# Patient Record
Sex: Female | Born: 1935 | Hispanic: No | Marital: Married | Smoking: Former smoker
Health system: Southern US, Community
[De-identification: ages and names within clinical notes are randomized; demographics above are authoritative.]

## PROBLEM LIST (undated history)

## (undated) DIAGNOSIS — F0280 Dementia in other diseases classified elsewhere without behavioral disturbance: Secondary | ICD-10-CM

## (undated) DIAGNOSIS — F419 Anxiety disorder, unspecified: Secondary | ICD-10-CM

## (undated) DIAGNOSIS — G309 Alzheimer's disease, unspecified: Secondary | ICD-10-CM

## (undated) DIAGNOSIS — F32A Depression, unspecified: Secondary | ICD-10-CM

## (undated) DIAGNOSIS — R630 Anorexia: Secondary | ICD-10-CM

## (undated) DIAGNOSIS — F329 Major depressive disorder, single episode, unspecified: Secondary | ICD-10-CM

## (undated) HISTORY — PX: APPENDECTOMY: SHX54

## (undated) HISTORY — DX: Major depressive disorder, single episode, unspecified: F32.9

## (undated) HISTORY — PX: EYE SURGERY: SHX253

## (undated) HISTORY — DX: Anxiety disorder, unspecified: F41.9

## (undated) HISTORY — DX: Depression, unspecified: F32.A

---

## 2001-10-15 ENCOUNTER — Other Ambulatory Visit: Admission: RE | Admit: 2001-10-15 | Discharge: 2001-10-15 | Payer: Self-pay | Admitting: Gynecology

## 2002-10-18 ENCOUNTER — Other Ambulatory Visit: Admission: RE | Admit: 2002-10-18 | Discharge: 2002-10-18 | Payer: Self-pay | Admitting: Gynecology

## 2004-01-21 ENCOUNTER — Other Ambulatory Visit: Admission: RE | Admit: 2004-01-21 | Discharge: 2004-01-21 | Payer: Self-pay | Admitting: Gynecology

## 2005-06-06 ENCOUNTER — Other Ambulatory Visit: Admission: RE | Admit: 2005-06-06 | Discharge: 2005-06-06 | Payer: Self-pay | Admitting: Gynecology

## 2008-03-19 ENCOUNTER — Other Ambulatory Visit: Admission: RE | Admit: 2008-03-19 | Discharge: 2008-03-19 | Payer: Self-pay | Admitting: Gynecology

## 2008-03-19 ENCOUNTER — Encounter: Payer: Self-pay | Admitting: Gynecology

## 2008-03-19 ENCOUNTER — Ambulatory Visit: Payer: Self-pay | Admitting: Gynecology

## 2011-04-05 DIAGNOSIS — R413 Other amnesia: Secondary | ICD-10-CM | POA: Diagnosis not present

## 2011-04-05 DIAGNOSIS — E785 Hyperlipidemia, unspecified: Secondary | ICD-10-CM | POA: Diagnosis not present

## 2011-04-05 DIAGNOSIS — K3189 Other diseases of stomach and duodenum: Secondary | ICD-10-CM | POA: Diagnosis not present

## 2011-04-05 DIAGNOSIS — R1013 Epigastric pain: Secondary | ICD-10-CM | POA: Diagnosis not present

## 2011-04-05 DIAGNOSIS — I10 Essential (primary) hypertension: Secondary | ICD-10-CM | POA: Diagnosis not present

## 2011-04-06 DIAGNOSIS — H2589 Other age-related cataract: Secondary | ICD-10-CM | POA: Diagnosis not present

## 2011-04-06 DIAGNOSIS — H269 Unspecified cataract: Secondary | ICD-10-CM | POA: Diagnosis not present

## 2011-04-11 DIAGNOSIS — M719 Bursopathy, unspecified: Secondary | ICD-10-CM | POA: Diagnosis not present

## 2011-04-11 DIAGNOSIS — M67919 Unspecified disorder of synovium and tendon, unspecified shoulder: Secondary | ICD-10-CM | POA: Diagnosis not present

## 2011-04-11 DIAGNOSIS — R52 Pain, unspecified: Secondary | ICD-10-CM | POA: Diagnosis not present

## 2011-04-11 DIAGNOSIS — M25519 Pain in unspecified shoulder: Secondary | ICD-10-CM | POA: Diagnosis not present

## 2011-04-21 DIAGNOSIS — M19019 Primary osteoarthritis, unspecified shoulder: Secondary | ICD-10-CM | POA: Diagnosis not present

## 2011-04-21 DIAGNOSIS — M67919 Unspecified disorder of synovium and tendon, unspecified shoulder: Secondary | ICD-10-CM | POA: Diagnosis not present

## 2011-04-22 DIAGNOSIS — M19019 Primary osteoarthritis, unspecified shoulder: Secondary | ICD-10-CM | POA: Diagnosis not present

## 2011-04-26 DIAGNOSIS — M19019 Primary osteoarthritis, unspecified shoulder: Secondary | ICD-10-CM | POA: Diagnosis not present

## 2011-04-28 DIAGNOSIS — M19019 Primary osteoarthritis, unspecified shoulder: Secondary | ICD-10-CM | POA: Diagnosis not present

## 2011-05-04 DIAGNOSIS — H2589 Other age-related cataract: Secondary | ICD-10-CM | POA: Diagnosis not present

## 2011-05-04 DIAGNOSIS — H269 Unspecified cataract: Secondary | ICD-10-CM | POA: Diagnosis not present

## 2011-05-10 DIAGNOSIS — M19019 Primary osteoarthritis, unspecified shoulder: Secondary | ICD-10-CM | POA: Diagnosis not present

## 2011-05-11 DIAGNOSIS — R300 Dysuria: Secondary | ICD-10-CM | POA: Diagnosis not present

## 2011-05-11 DIAGNOSIS — N390 Urinary tract infection, site not specified: Secondary | ICD-10-CM | POA: Diagnosis not present

## 2011-05-12 DIAGNOSIS — N390 Urinary tract infection, site not specified: Secondary | ICD-10-CM | POA: Diagnosis not present

## 2011-05-12 DIAGNOSIS — M19019 Primary osteoarthritis, unspecified shoulder: Secondary | ICD-10-CM | POA: Diagnosis not present

## 2011-05-17 DIAGNOSIS — H43829 Vitreomacular adhesion, unspecified eye: Secondary | ICD-10-CM | POA: Diagnosis not present

## 2011-05-17 DIAGNOSIS — Z961 Presence of intraocular lens: Secondary | ICD-10-CM | POA: Diagnosis not present

## 2011-05-17 DIAGNOSIS — H43819 Vitreous degeneration, unspecified eye: Secondary | ICD-10-CM | POA: Diagnosis not present

## 2011-05-29 DIAGNOSIS — I10 Essential (primary) hypertension: Secondary | ICD-10-CM | POA: Diagnosis not present

## 2011-05-29 DIAGNOSIS — R55 Syncope and collapse: Secondary | ICD-10-CM | POA: Diagnosis not present

## 2011-05-29 DIAGNOSIS — N390 Urinary tract infection, site not specified: Secondary | ICD-10-CM | POA: Diagnosis not present

## 2011-05-30 DIAGNOSIS — I10 Essential (primary) hypertension: Secondary | ICD-10-CM | POA: Diagnosis not present

## 2011-05-30 DIAGNOSIS — N390 Urinary tract infection, site not specified: Secondary | ICD-10-CM | POA: Diagnosis not present

## 2011-05-30 DIAGNOSIS — R55 Syncope and collapse: Secondary | ICD-10-CM | POA: Diagnosis not present

## 2011-06-03 DIAGNOSIS — H43819 Vitreous degeneration, unspecified eye: Secondary | ICD-10-CM | POA: Diagnosis not present

## 2011-06-03 DIAGNOSIS — R55 Syncope and collapse: Secondary | ICD-10-CM | POA: Diagnosis not present

## 2011-06-03 DIAGNOSIS — Z961 Presence of intraocular lens: Secondary | ICD-10-CM | POA: Diagnosis not present

## 2011-06-03 DIAGNOSIS — H43829 Vitreomacular adhesion, unspecified eye: Secondary | ICD-10-CM | POA: Diagnosis not present

## 2011-07-04 DIAGNOSIS — R413 Other amnesia: Secondary | ICD-10-CM | POA: Diagnosis not present

## 2011-07-04 DIAGNOSIS — I10 Essential (primary) hypertension: Secondary | ICD-10-CM | POA: Diagnosis not present

## 2011-07-04 DIAGNOSIS — F329 Major depressive disorder, single episode, unspecified: Secondary | ICD-10-CM | POA: Insufficient documentation

## 2011-07-04 DIAGNOSIS — E785 Hyperlipidemia, unspecified: Secondary | ICD-10-CM | POA: Diagnosis not present

## 2011-07-04 DIAGNOSIS — F32A Depression, unspecified: Secondary | ICD-10-CM | POA: Insufficient documentation

## 2011-07-05 DIAGNOSIS — Z961 Presence of intraocular lens: Secondary | ICD-10-CM | POA: Diagnosis not present

## 2011-07-05 DIAGNOSIS — H43819 Vitreous degeneration, unspecified eye: Secondary | ICD-10-CM | POA: Diagnosis not present

## 2011-07-05 DIAGNOSIS — H43829 Vitreomacular adhesion, unspecified eye: Secondary | ICD-10-CM | POA: Diagnosis not present

## 2011-07-14 DIAGNOSIS — R55 Syncope and collapse: Secondary | ICD-10-CM | POA: Diagnosis not present

## 2011-07-14 DIAGNOSIS — R413 Other amnesia: Secondary | ICD-10-CM | POA: Diagnosis not present

## 2011-07-14 DIAGNOSIS — I10 Essential (primary) hypertension: Secondary | ICD-10-CM | POA: Diagnosis not present

## 2011-07-26 DIAGNOSIS — R413 Other amnesia: Secondary | ICD-10-CM | POA: Diagnosis not present

## 2011-08-10 DIAGNOSIS — W010XXA Fall on same level from slipping, tripping and stumbling without subsequent striking against object, initial encounter: Secondary | ICD-10-CM | POA: Diagnosis not present

## 2011-08-10 DIAGNOSIS — E669 Obesity, unspecified: Secondary | ICD-10-CM | POA: Diagnosis present

## 2011-08-10 DIAGNOSIS — W19XXXA Unspecified fall, initial encounter: Secondary | ICD-10-CM | POA: Diagnosis not present

## 2011-08-10 DIAGNOSIS — M899 Disorder of bone, unspecified: Secondary | ICD-10-CM | POA: Diagnosis present

## 2011-08-10 DIAGNOSIS — Z9181 History of falling: Secondary | ICD-10-CM | POA: Diagnosis not present

## 2011-08-10 DIAGNOSIS — M8448XA Pathological fracture, other site, initial encounter for fracture: Secondary | ICD-10-CM | POA: Diagnosis not present

## 2011-08-10 DIAGNOSIS — S32009A Unspecified fracture of unspecified lumbar vertebra, initial encounter for closed fracture: Secondary | ICD-10-CM | POA: Diagnosis not present

## 2011-08-10 DIAGNOSIS — M5380 Other specified dorsopathies, site unspecified: Secondary | ICD-10-CM | POA: Diagnosis not present

## 2011-08-10 DIAGNOSIS — M546 Pain in thoracic spine: Secondary | ICD-10-CM | POA: Diagnosis not present

## 2011-08-10 DIAGNOSIS — S0003XA Contusion of scalp, initial encounter: Secondary | ICD-10-CM | POA: Diagnosis not present

## 2011-08-10 DIAGNOSIS — S0083XA Contusion of other part of head, initial encounter: Secondary | ICD-10-CM | POA: Diagnosis not present

## 2011-08-10 DIAGNOSIS — M5126 Other intervertebral disc displacement, lumbar region: Secondary | ICD-10-CM | POA: Diagnosis not present

## 2011-08-10 DIAGNOSIS — G8911 Acute pain due to trauma: Secondary | ICD-10-CM | POA: Diagnosis not present

## 2011-08-10 DIAGNOSIS — R079 Chest pain, unspecified: Secondary | ICD-10-CM | POA: Diagnosis not present

## 2011-08-10 DIAGNOSIS — M549 Dorsalgia, unspecified: Secondary | ICD-10-CM | POA: Diagnosis not present

## 2011-08-10 DIAGNOSIS — K5900 Constipation, unspecified: Secondary | ICD-10-CM | POA: Diagnosis present

## 2011-08-10 DIAGNOSIS — M25559 Pain in unspecified hip: Secondary | ICD-10-CM | POA: Diagnosis not present

## 2011-08-10 DIAGNOSIS — F0390 Unspecified dementia without behavioral disturbance: Secondary | ICD-10-CM | POA: Diagnosis not present

## 2011-08-10 DIAGNOSIS — M545 Low back pain, unspecified: Secondary | ICD-10-CM | POA: Diagnosis not present

## 2011-08-10 DIAGNOSIS — S7000XA Contusion of unspecified hip, initial encounter: Secondary | ICD-10-CM | POA: Diagnosis not present

## 2011-08-10 DIAGNOSIS — R110 Nausea: Secondary | ICD-10-CM | POA: Diagnosis not present

## 2011-08-10 DIAGNOSIS — S1093XA Contusion of unspecified part of neck, initial encounter: Secondary | ICD-10-CM | POA: Diagnosis not present

## 2011-08-10 DIAGNOSIS — I10 Essential (primary) hypertension: Secondary | ICD-10-CM | POA: Diagnosis not present

## 2011-09-02 DIAGNOSIS — S32009A Unspecified fracture of unspecified lumbar vertebra, initial encounter for closed fracture: Secondary | ICD-10-CM | POA: Diagnosis not present

## 2011-09-12 DIAGNOSIS — S32009A Unspecified fracture of unspecified lumbar vertebra, initial encounter for closed fracture: Secondary | ICD-10-CM | POA: Diagnosis not present

## 2011-09-12 DIAGNOSIS — M899 Disorder of bone, unspecified: Secondary | ICD-10-CM | POA: Diagnosis not present

## 2011-10-27 DIAGNOSIS — I10 Essential (primary) hypertension: Secondary | ICD-10-CM | POA: Diagnosis not present

## 2011-10-27 DIAGNOSIS — R413 Other amnesia: Secondary | ICD-10-CM | POA: Diagnosis not present

## 2011-10-27 DIAGNOSIS — R55 Syncope and collapse: Secondary | ICD-10-CM | POA: Diagnosis not present

## 2011-10-27 DIAGNOSIS — F329 Major depressive disorder, single episode, unspecified: Secondary | ICD-10-CM | POA: Diagnosis not present

## 2011-11-03 DIAGNOSIS — H43819 Vitreous degeneration, unspecified eye: Secondary | ICD-10-CM | POA: Diagnosis not present

## 2011-11-03 DIAGNOSIS — Z961 Presence of intraocular lens: Secondary | ICD-10-CM | POA: Diagnosis not present

## 2011-12-27 DIAGNOSIS — Z23 Encounter for immunization: Secondary | ICD-10-CM | POA: Diagnosis not present

## 2012-01-17 DIAGNOSIS — R05 Cough: Secondary | ICD-10-CM | POA: Diagnosis not present

## 2012-01-17 DIAGNOSIS — R0989 Other specified symptoms and signs involving the circulatory and respiratory systems: Secondary | ICD-10-CM | POA: Diagnosis not present

## 2012-01-31 DIAGNOSIS — I10 Essential (primary) hypertension: Secondary | ICD-10-CM | POA: Diagnosis not present

## 2012-01-31 DIAGNOSIS — R1013 Epigastric pain: Secondary | ICD-10-CM | POA: Diagnosis not present

## 2012-01-31 DIAGNOSIS — F329 Major depressive disorder, single episode, unspecified: Secondary | ICD-10-CM | POA: Diagnosis not present

## 2012-01-31 DIAGNOSIS — M159 Polyosteoarthritis, unspecified: Secondary | ICD-10-CM | POA: Diagnosis not present

## 2012-01-31 DIAGNOSIS — K3189 Other diseases of stomach and duodenum: Secondary | ICD-10-CM | POA: Diagnosis not present

## 2012-02-06 DIAGNOSIS — E785 Hyperlipidemia, unspecified: Secondary | ICD-10-CM | POA: Diagnosis not present

## 2012-02-06 DIAGNOSIS — R7309 Other abnormal glucose: Secondary | ICD-10-CM | POA: Diagnosis not present

## 2012-02-06 DIAGNOSIS — I10 Essential (primary) hypertension: Secondary | ICD-10-CM | POA: Diagnosis not present

## 2012-02-06 DIAGNOSIS — Z79899 Other long term (current) drug therapy: Secondary | ICD-10-CM | POA: Diagnosis not present

## 2012-02-06 DIAGNOSIS — R413 Other amnesia: Secondary | ICD-10-CM | POA: Diagnosis not present

## 2012-05-01 DIAGNOSIS — H43819 Vitreous degeneration, unspecified eye: Secondary | ICD-10-CM | POA: Diagnosis not present

## 2012-05-01 DIAGNOSIS — H2510 Age-related nuclear cataract, unspecified eye: Secondary | ICD-10-CM | POA: Diagnosis not present

## 2012-05-16 DIAGNOSIS — I10 Essential (primary) hypertension: Secondary | ICD-10-CM | POA: Diagnosis not present

## 2012-05-16 DIAGNOSIS — R413 Other amnesia: Secondary | ICD-10-CM | POA: Diagnosis not present

## 2012-05-16 DIAGNOSIS — F329 Major depressive disorder, single episode, unspecified: Secondary | ICD-10-CM | POA: Diagnosis not present

## 2012-05-16 DIAGNOSIS — E785 Hyperlipidemia, unspecified: Secondary | ICD-10-CM | POA: Diagnosis not present

## 2012-05-21 DIAGNOSIS — E785 Hyperlipidemia, unspecified: Secondary | ICD-10-CM | POA: Diagnosis not present

## 2012-05-21 DIAGNOSIS — Z79899 Other long term (current) drug therapy: Secondary | ICD-10-CM | POA: Diagnosis not present

## 2012-05-21 DIAGNOSIS — R413 Other amnesia: Secondary | ICD-10-CM | POA: Diagnosis not present

## 2012-06-20 DIAGNOSIS — Z79899 Other long term (current) drug therapy: Secondary | ICD-10-CM | POA: Diagnosis not present

## 2012-06-20 DIAGNOSIS — IMO0001 Reserved for inherently not codable concepts without codable children: Secondary | ICD-10-CM | POA: Diagnosis not present

## 2012-06-20 DIAGNOSIS — M25539 Pain in unspecified wrist: Secondary | ICD-10-CM | POA: Diagnosis not present

## 2012-06-20 DIAGNOSIS — G44309 Post-traumatic headache, unspecified, not intractable: Secondary | ICD-10-CM | POA: Diagnosis not present

## 2012-06-20 DIAGNOSIS — S52599A Other fractures of lower end of unspecified radius, initial encounter for closed fracture: Secondary | ICD-10-CM | POA: Diagnosis not present

## 2012-06-20 DIAGNOSIS — S61209A Unspecified open wound of unspecified finger without damage to nail, initial encounter: Secondary | ICD-10-CM | POA: Diagnosis not present

## 2012-06-20 DIAGNOSIS — S63279A Dislocation of unspecified interphalangeal joint of unspecified finger, initial encounter: Secondary | ICD-10-CM | POA: Diagnosis not present

## 2012-06-20 DIAGNOSIS — E785 Hyperlipidemia, unspecified: Secondary | ICD-10-CM | POA: Diagnosis not present

## 2012-06-20 DIAGNOSIS — S1093XA Contusion of unspecified part of neck, initial encounter: Secondary | ICD-10-CM | POA: Diagnosis not present

## 2012-06-20 DIAGNOSIS — S0083XA Contusion of other part of head, initial encounter: Secondary | ICD-10-CM | POA: Diagnosis not present

## 2012-06-20 DIAGNOSIS — I10 Essential (primary) hypertension: Secondary | ICD-10-CM | POA: Diagnosis not present

## 2012-06-20 DIAGNOSIS — S0003XA Contusion of scalp, initial encounter: Secondary | ICD-10-CM | POA: Diagnosis not present

## 2012-06-20 DIAGNOSIS — R51 Headache: Secondary | ICD-10-CM | POA: Diagnosis not present

## 2012-06-29 DIAGNOSIS — M19049 Primary osteoarthritis, unspecified hand: Secondary | ICD-10-CM | POA: Diagnosis not present

## 2012-06-29 DIAGNOSIS — S63279A Dislocation of unspecified interphalangeal joint of unspecified finger, initial encounter: Secondary | ICD-10-CM | POA: Diagnosis not present

## 2012-07-25 DIAGNOSIS — R413 Other amnesia: Secondary | ICD-10-CM | POA: Diagnosis not present

## 2012-07-30 DIAGNOSIS — S63279A Dislocation of unspecified interphalangeal joint of unspecified finger, initial encounter: Secondary | ICD-10-CM | POA: Diagnosis not present

## 2012-07-30 DIAGNOSIS — M19049 Primary osteoarthritis, unspecified hand: Secondary | ICD-10-CM | POA: Diagnosis not present

## 2012-08-15 DIAGNOSIS — E785 Hyperlipidemia, unspecified: Secondary | ICD-10-CM | POA: Diagnosis not present

## 2012-08-15 DIAGNOSIS — I10 Essential (primary) hypertension: Secondary | ICD-10-CM | POA: Diagnosis not present

## 2012-08-15 DIAGNOSIS — F329 Major depressive disorder, single episode, unspecified: Secondary | ICD-10-CM | POA: Diagnosis not present

## 2012-08-15 DIAGNOSIS — R413 Other amnesia: Secondary | ICD-10-CM | POA: Diagnosis not present

## 2012-08-30 DIAGNOSIS — I8390 Asymptomatic varicose veins of unspecified lower extremity: Secondary | ICD-10-CM | POA: Diagnosis not present

## 2012-08-30 DIAGNOSIS — R58 Hemorrhage, not elsewhere classified: Secondary | ICD-10-CM | POA: Diagnosis not present

## 2012-12-12 DIAGNOSIS — H43819 Vitreous degeneration, unspecified eye: Secondary | ICD-10-CM | POA: Diagnosis not present

## 2012-12-12 DIAGNOSIS — Z961 Presence of intraocular lens: Secondary | ICD-10-CM | POA: Diagnosis not present

## 2012-12-12 DIAGNOSIS — D3120 Benign neoplasm of unspecified retina: Secondary | ICD-10-CM | POA: Diagnosis not present

## 2012-12-12 DIAGNOSIS — H2640 Unspecified secondary cataract: Secondary | ICD-10-CM | POA: Diagnosis not present

## 2013-01-08 DIAGNOSIS — Z23 Encounter for immunization: Secondary | ICD-10-CM | POA: Diagnosis not present

## 2013-02-25 DIAGNOSIS — N390 Urinary tract infection, site not specified: Secondary | ICD-10-CM | POA: Diagnosis not present

## 2013-02-25 DIAGNOSIS — S0990XA Unspecified injury of head, initial encounter: Secondary | ICD-10-CM | POA: Diagnosis not present

## 2013-02-25 DIAGNOSIS — R5381 Other malaise: Secondary | ICD-10-CM | POA: Diagnosis not present

## 2013-02-25 DIAGNOSIS — R5383 Other fatigue: Secondary | ICD-10-CM | POA: Diagnosis not present

## 2013-02-25 DIAGNOSIS — E876 Hypokalemia: Secondary | ICD-10-CM | POA: Diagnosis not present

## 2013-02-25 DIAGNOSIS — I10 Essential (primary) hypertension: Secondary | ICD-10-CM | POA: Diagnosis not present

## 2013-02-25 DIAGNOSIS — S0993XA Unspecified injury of face, initial encounter: Secondary | ICD-10-CM | POA: Diagnosis not present

## 2013-02-25 DIAGNOSIS — Z87891 Personal history of nicotine dependence: Secondary | ICD-10-CM | POA: Diagnosis not present

## 2013-03-25 DIAGNOSIS — N390 Urinary tract infection, site not specified: Secondary | ICD-10-CM | POA: Diagnosis not present

## 2013-03-25 DIAGNOSIS — R413 Other amnesia: Secondary | ICD-10-CM | POA: Diagnosis not present

## 2013-03-25 DIAGNOSIS — E876 Hypokalemia: Secondary | ICD-10-CM | POA: Diagnosis not present

## 2013-03-25 DIAGNOSIS — I10 Essential (primary) hypertension: Secondary | ICD-10-CM | POA: Diagnosis not present

## 2013-04-09 DIAGNOSIS — R413 Other amnesia: Secondary | ICD-10-CM | POA: Diagnosis not present

## 2013-04-09 DIAGNOSIS — G319 Degenerative disease of nervous system, unspecified: Secondary | ICD-10-CM | POA: Diagnosis not present

## 2013-04-09 DIAGNOSIS — I6789 Other cerebrovascular disease: Secondary | ICD-10-CM | POA: Diagnosis not present

## 2013-04-09 DIAGNOSIS — F29 Unspecified psychosis not due to a substance or known physiological condition: Secondary | ICD-10-CM | POA: Diagnosis not present

## 2013-05-03 DIAGNOSIS — K3189 Other diseases of stomach and duodenum: Secondary | ICD-10-CM | POA: Diagnosis not present

## 2013-05-03 DIAGNOSIS — R1013 Epigastric pain: Secondary | ICD-10-CM | POA: Diagnosis not present

## 2013-05-03 DIAGNOSIS — R413 Other amnesia: Secondary | ICD-10-CM | POA: Diagnosis not present

## 2013-05-03 DIAGNOSIS — I10 Essential (primary) hypertension: Secondary | ICD-10-CM | POA: Diagnosis not present

## 2013-05-03 DIAGNOSIS — R300 Dysuria: Secondary | ICD-10-CM | POA: Diagnosis not present

## 2013-06-13 DIAGNOSIS — H2640 Unspecified secondary cataract: Secondary | ICD-10-CM | POA: Diagnosis not present

## 2013-06-13 DIAGNOSIS — Z961 Presence of intraocular lens: Secondary | ICD-10-CM | POA: Diagnosis not present

## 2013-07-05 DIAGNOSIS — M7710 Lateral epicondylitis, unspecified elbow: Secondary | ICD-10-CM | POA: Diagnosis not present

## 2013-07-05 DIAGNOSIS — M67919 Unspecified disorder of synovium and tendon, unspecified shoulder: Secondary | ICD-10-CM | POA: Diagnosis not present

## 2013-07-10 DIAGNOSIS — T148XXA Other injury of unspecified body region, initial encounter: Secondary | ICD-10-CM | POA: Diagnosis not present

## 2013-07-10 DIAGNOSIS — M25519 Pain in unspecified shoulder: Secondary | ICD-10-CM | POA: Diagnosis not present

## 2013-07-10 DIAGNOSIS — S46909A Unspecified injury of unspecified muscle, fascia and tendon at shoulder and upper arm level, unspecified arm, initial encounter: Secondary | ICD-10-CM | POA: Diagnosis not present

## 2013-07-10 DIAGNOSIS — S4980XA Other specified injuries of shoulder and upper arm, unspecified arm, initial encounter: Secondary | ICD-10-CM | POA: Diagnosis not present

## 2013-07-10 DIAGNOSIS — M436 Torticollis: Secondary | ICD-10-CM | POA: Diagnosis not present

## 2013-08-05 DIAGNOSIS — M542 Cervicalgia: Secondary | ICD-10-CM | POA: Diagnosis not present

## 2013-08-05 DIAGNOSIS — M25519 Pain in unspecified shoulder: Secondary | ICD-10-CM | POA: Diagnosis not present

## 2013-08-13 DIAGNOSIS — M25519 Pain in unspecified shoulder: Secondary | ICD-10-CM | POA: Diagnosis not present

## 2013-08-14 DIAGNOSIS — M25519 Pain in unspecified shoulder: Secondary | ICD-10-CM | POA: Diagnosis not present

## 2013-08-15 DIAGNOSIS — M25519 Pain in unspecified shoulder: Secondary | ICD-10-CM | POA: Diagnosis not present

## 2013-08-19 DIAGNOSIS — M25519 Pain in unspecified shoulder: Secondary | ICD-10-CM | POA: Diagnosis not present

## 2013-08-21 DIAGNOSIS — M19019 Primary osteoarthritis, unspecified shoulder: Secondary | ICD-10-CM | POA: Diagnosis not present

## 2013-09-03 DIAGNOSIS — H26499 Other secondary cataract, unspecified eye: Secondary | ICD-10-CM | POA: Diagnosis not present

## 2013-09-13 DIAGNOSIS — H832X9 Labyrinthine dysfunction, unspecified ear: Secondary | ICD-10-CM | POA: Diagnosis not present

## 2013-09-13 DIAGNOSIS — Z961 Presence of intraocular lens: Secondary | ICD-10-CM | POA: Diagnosis not present

## 2013-12-24 DIAGNOSIS — Z23 Encounter for immunization: Secondary | ICD-10-CM | POA: Diagnosis not present

## 2014-01-09 DIAGNOSIS — R300 Dysuria: Secondary | ICD-10-CM | POA: Diagnosis not present

## 2014-01-23 DIAGNOSIS — G301 Alzheimer's disease with late onset: Secondary | ICD-10-CM | POA: Diagnosis not present

## 2014-02-10 DIAGNOSIS — F0391 Unspecified dementia with behavioral disturbance: Secondary | ICD-10-CM | POA: Insufficient documentation

## 2014-02-10 DIAGNOSIS — F03918 Unspecified dementia, unspecified severity, with other behavioral disturbance: Secondary | ICD-10-CM | POA: Insufficient documentation

## 2014-02-10 DIAGNOSIS — F0151 Vascular dementia with behavioral disturbance: Secondary | ICD-10-CM | POA: Diagnosis not present

## 2014-02-10 DIAGNOSIS — G309 Alzheimer's disease, unspecified: Secondary | ICD-10-CM | POA: Diagnosis not present

## 2014-03-12 ENCOUNTER — Encounter (HOSPITAL_COMMUNITY): Payer: Self-pay | Admitting: Emergency Medicine

## 2014-03-12 ENCOUNTER — Emergency Department (HOSPITAL_COMMUNITY): Payer: Medicare Other

## 2014-03-12 ENCOUNTER — Emergency Department (HOSPITAL_COMMUNITY)
Admission: EM | Admit: 2014-03-12 | Discharge: 2014-03-12 | Disposition: A | Discharge status: Home / Self Care | Payer: Medicare Other | Attending: Emergency Medicine | Admitting: Emergency Medicine

## 2014-03-12 DIAGNOSIS — R55 Syncope and collapse: Secondary | ICD-10-CM | POA: Diagnosis present

## 2014-03-12 DIAGNOSIS — R4020 Unspecified coma: Secondary | ICD-10-CM | POA: Insufficient documentation

## 2014-03-12 DIAGNOSIS — G309 Alzheimer's disease, unspecified: Secondary | ICD-10-CM | POA: Insufficient documentation

## 2014-03-12 DIAGNOSIS — R40242 Glasgow coma scale score 9-12: Secondary | ICD-10-CM | POA: Diagnosis not present

## 2014-03-12 DIAGNOSIS — I739 Peripheral vascular disease, unspecified: Secondary | ICD-10-CM | POA: Diagnosis not present

## 2014-03-12 DIAGNOSIS — R410 Disorientation, unspecified: Secondary | ICD-10-CM | POA: Insufficient documentation

## 2014-03-12 DIAGNOSIS — R001 Bradycardia, unspecified: Secondary | ICD-10-CM | POA: Diagnosis not present

## 2014-03-12 DIAGNOSIS — R4182 Altered mental status, unspecified: Secondary | ICD-10-CM | POA: Diagnosis not present

## 2014-03-12 DIAGNOSIS — R404 Transient alteration of awareness: Secondary | ICD-10-CM

## 2014-03-12 HISTORY — DX: Alzheimer's disease, unspecified: G30.9

## 2014-03-12 HISTORY — DX: Dementia in other diseases classified elsewhere, unspecified severity, without behavioral disturbance, psychotic disturbance, mood disturbance, and anxiety: F02.80

## 2014-03-12 LAB — CBC WITH DIFFERENTIAL/PLATELET
Basophils Absolute: 0.1 10*3/uL (ref 0.0–0.1)
Basophils Relative: 1 % (ref 0–1)
EOS PCT: 3 % (ref 0–5)
Eosinophils Absolute: 0.2 10*3/uL (ref 0.0–0.7)
HCT: 42.5 % (ref 36.0–46.0)
HEMOGLOBIN: 13.8 g/dL (ref 12.0–15.0)
LYMPHS ABS: 1.5 10*3/uL (ref 0.7–4.0)
LYMPHS PCT: 24 % (ref 12–46)
MCH: 30.8 pg (ref 26.0–34.0)
MCHC: 32.5 g/dL (ref 30.0–36.0)
MCV: 94.9 fL (ref 78.0–100.0)
MONOS PCT: 9 % (ref 3–12)
Monocytes Absolute: 0.6 10*3/uL (ref 0.1–1.0)
Neutro Abs: 3.9 10*3/uL (ref 1.7–7.7)
Neutrophils Relative %: 63 % (ref 43–77)
PLATELETS: 179 10*3/uL (ref 150–400)
RBC: 4.48 MIL/uL (ref 3.87–5.11)
RDW: 13.8 % (ref 11.5–15.5)
WBC: 6.2 10*3/uL (ref 4.0–10.5)

## 2014-03-12 LAB — URINALYSIS, ROUTINE W REFLEX MICROSCOPIC
BILIRUBIN URINE: NEGATIVE
Glucose, UA: NEGATIVE mg/dL
Hgb urine dipstick: NEGATIVE
Ketones, ur: NEGATIVE mg/dL
Leukocytes, UA: NEGATIVE
NITRITE: NEGATIVE
PH: 5 (ref 5.0–8.0)
Protein, ur: NEGATIVE mg/dL
Specific Gravity, Urine: 1.023 (ref 1.005–1.030)
Urobilinogen, UA: 0.2 mg/dL (ref 0.0–1.0)

## 2014-03-12 LAB — COMPREHENSIVE METABOLIC PANEL
ALBUMIN: 3.5 g/dL (ref 3.5–5.2)
ALT: 14 U/L (ref 0–35)
AST: 20 U/L (ref 0–37)
Alkaline Phosphatase: 68 U/L (ref 39–117)
Anion gap: 8 (ref 5–15)
BUN: 11 mg/dL (ref 6–23)
CALCIUM: 9.3 mg/dL (ref 8.4–10.5)
CO2: 28 mmol/L (ref 19–32)
CREATININE: 0.75 mg/dL (ref 0.50–1.10)
Chloride: 104 mEq/L (ref 96–112)
GFR calc Af Amer: 90 mL/min — ABNORMAL LOW (ref >90)
GFR, EST NON AFRICAN AMERICAN: 77 mL/min — AB (ref >90)
Glucose, Bld: 110 mg/dL — ABNORMAL HIGH (ref 70–99)
Potassium: 3.8 mmol/L (ref 3.5–5.1)
SODIUM: 140 mmol/L (ref 135–145)
TOTAL PROTEIN: 6.5 g/dL (ref 6.0–8.3)
Total Bilirubin: 0.5 mg/dL (ref 0.3–1.2)

## 2014-03-12 LAB — TROPONIN I

## 2014-03-12 NOTE — ED Notes (Signed)
EMS - Patient coming from [employer] with loss of consciousness.  Patients husband states wife went to bed around 9:00pm last night and he attempted to wake her 07:15am today with patient being unresponsive.  Patient was arousable to painful stimuli with EMS.  140/60, HR 70, 109 CBG and 12 respirations.    ED - Patient is alert and responding to staff and family at bedside.

## 2014-03-12 NOTE — Discharge Instructions (Signed)
Follow-up with [facility] neurology in the next week. The contact information has been provided in this discharge summary. Call to arrange this appointment.  Return to the emergency department if your symptoms recur, or you develop any other new and concerning symptoms.   Altered Mental Status Altered mental status most often refers to an abnormal change in your responsiveness and awareness. It can affect your speech, thought, mobility, memory, attention span, or alertness. It can range from slight confusion to complete unresponsiveness (coma). Altered mental status can be a sign of a serious underlying medical condition. Rapid evaluation and medical treatment is necessary for patients having an altered mental status. CAUSES   Low blood sugar (hypoglycemia) or diabetes.  Severe loss of body fluids (dehydration) or a body salt (electrolyte) imbalance.  A stroke or other neurologic problem, such as dementia or delirium.  A head injury or tumor.  A drug or alcohol overdose.  Exposure to toxins or poisons.  Depression, anxiety, and stress.  A low oxygen level (hypoxia).  An infection.  Blood loss.  Twitching or shaking (seizure).  Heart problems, such as heart attack or heart rhythm problems (arrhythmias).  A body temperature that is too low or too high (hypothermia or hyperthermia). DIAGNOSIS  A diagnosis is based on your history, symptoms, physical and neurologic examinations, and diagnostic tests. Diagnostic tests may include:  Measurement of your blood pressure, pulse, breathing, and oxygen levels (vital signs).  Blood tests.  Urine tests.  X-ray exams.  A computerized magnetic scan (magnetic resonance imaging, MRI).  A computerized X-ray scan (computed tomography, CT scan). TREATMENT  Treatment will depend on the cause. Treatment may include:  Management of an underlying medical or mental health condition.  Critical care or support in the hospital. [place]   Only take over-the-counter or prescription medicines for pain, discomfort, or fever as directed by your caregiver.  Manage underlying conditions as directed by your caregiver.  Eat a healthy, well-balanced diet to maintain strength.  Join a support group or prevention program to cope with the condition or trauma that caused the altered mental status. Ask your caregiver to help choose a program that works for you.  Follow up with your caregiver for further examination, therapy, or testing as directed. SEEK MEDICAL CARE IF:   You feel unwell or have chills.  You or your family notice a change in your behavior or your alertness.  You have trouble following your caregiver's treatment plan.  You have questions or concerns. SEEK IMMEDIATE MEDICAL CARE IF:   You have a rapid heartbeat or have chest pain.  You have difficulty breathing.  You have a fever.  You have a headache with a stiff neck.  You cough up blood.  You have blood in your urine or stool.  You have severe agitation or confusion. MAKE SURE YOU:   Understand these instructions.  Will watch your condition.  Will get help right away if you are not doing well or get worse. Document Released: 08/25/2009 Document Revised: 05/30/2011 Document Reviewed: 08/25/2009 [facility] Patient Information 2015 [place], [place]. This information is not intended to replace advice given to you by your health care provider. Make sure you discuss any questions you have with your health care provider.

## 2014-03-12 NOTE — ED Provider Notes (Signed)
CSN: [id number]     Arrival date & time 03/12/14  0902 History   First MD Initiated Contact with Patient 03/12/14 [phone]     Chief Complaint  Patient presents with  . Loss of Consciousness     (Consider location/radiation/quality/duration/timing/severity/associated sxs/prior Treatment) HPI Comments: Patient is an 80 year old female with history of dementia. She is brought by EMS for evaluation of altered level of consciousness. Her husband stated that she would not wake up this morning. He attempted to shake her, speak loudly to her, and stand her up, however she would not wake up. The husband called 23 and EMS arrived on scene. They found her to be responsive to only noxious stimuli and loud voice. She was then brought here for further evaluation. While in the ambulance, her mental status improved and she is now at her baseline. She has no complaints. Specifically she denies any headache, fever, chest pain, shortness of breath, abdominal pain. She denies any alcohol use. She also denies the use of any sleep aids, however she does take medication for her dementia in the evenings.  Patient is a 80 y.o. female presenting with syncope. The history is provided by the patient.  Loss of Consciousness Episode history:  Single Most recent episode:  Today Duration:  1 hour Timing:  Constant Progression:  Resolved Chronicity:  New Context comment:  Sleeping Witnessed: yes   Relieved by:  Nothing Worsened by:  Nothing tried Ineffective treatments:  None tried Associated symptoms: confusion     Past Medical History  Diagnosis Date  . Alzheimer's dementia   . Back pain    Past Surgical History  Procedure Laterality Date  . Back surgery    . Eye surgery    . Appendectomy     No family history on file. History  Substance Use Topics  . Smoking status: Never Smoker   . Smokeless tobacco: Not on file  . Alcohol Use: No   OB History    No data available     Review of Systems   Cardiovascular: Positive for syncope.  Psychiatric/Behavioral: Positive for confusion.  All other systems reviewed and are negative.     Allergies  Review of patient's allergies indicates no known allergies.  Home Medications   Prior to Admission medications   Not on File   BP 139/55 mmHg  Temp(Src) 98.7 F (37.1 C) (Oral)  Resp 18  Ht 4\' 10"  (1.473 m)  Wt 185 lb (83.915 kg)  BMI 38.68 kg/m2  SpO2 95% Physical Exam  Constitutional: She is oriented to person, place, and time. She appears well-developed and well-nourished. No distress.  HENT:  Head: Normocephalic and atraumatic.  Mouth/Throat: Oropharynx is clear and moist.  Eyes: Pupils are equal, round, and reactive to light.  Neck: Normal range of motion. Neck supple.  Cardiovascular: Normal rate and regular rhythm.  Exam reveals no gallop and no friction rub.   No murmur heard. Pulmonary/Chest: Effort normal and breath sounds normal. No respiratory distress. She has no wheezes.  Abdominal: Soft. Bowel sounds are normal. She exhibits no distension. There is no tenderness.  Musculoskeletal: Normal range of motion.  Lymphadenopathy:    She has no cervical adenopathy.  Neurological: She is alert and oriented to person, place, and time. No cranial nerve deficit. She exhibits normal muscle tone. Coordination normal.  Skin: Skin is warm and dry. She is not diaphoretic.  Nursing note and vitals reviewed.   ED Course  Procedures (including critical care time) Labs Review Labs  Reviewed  COMPREHENSIVE METABOLIC PANEL  CBC WITH DIFFERENTIAL  TROPONIN I  URINALYSIS, ROUTINE W REFLEX MICROSCOPIC    Imaging Review No results found.   Date: 03/12/2014  Rate: 53  Rhythm: sinus bradycardia  QRS Axis: left  Intervals: PR prolonged  ST/T Wave abnormalities: normal  Conduction Disutrbances:first-degree A-V block   Narrative Interpretation:   Old EKG Reviewed: none available    MDM   Final diagnoses:  None     Patient brought for evaluation of increased somnolence this morning. Her husband found her difficult to arouse from sleep. He became concerned to the extent that he called 911. She was brought here for evaluation and is now awake, alert, and neurologically intact. Workup reveals no acute process on her head CT and urinalysis and laboratory studies are otherwise unremarkable. I am uncertain as to what caused this episode, but nothing appears emergent. It could be related to her dementia medications she takes prior to bedtime, however this has never happened with these medicines in the past. Either way I feel as though she should be followed up with neurology. She is new to the area from [place] and has no local neurologist. She will be given the contact info for [facility] neurology and she can call to arrange this appointment.    Veryl Speak, MD 03/12/14 [phone]

## 2014-03-17 ENCOUNTER — Ambulatory Visit: Payer: Medicare Other | Admitting: Neurology

## 2014-03-31 DIAGNOSIS — F329 Major depressive disorder, single episode, unspecified: Secondary | ICD-10-CM | POA: Diagnosis not present

## 2014-03-31 DIAGNOSIS — Z23 Encounter for immunization: Secondary | ICD-10-CM | POA: Diagnosis not present

## 2014-03-31 DIAGNOSIS — G301 Alzheimer's disease with late onset: Secondary | ICD-10-CM | POA: Diagnosis not present

## 2014-04-21 ENCOUNTER — Ambulatory Visit (INDEPENDENT_AMBULATORY_CARE_PROVIDER_SITE_OTHER): Payer: Medicare Other | Admitting: Neurology

## 2014-04-21 ENCOUNTER — Encounter: Payer: Self-pay | Admitting: Neurology

## 2014-04-21 VITALS — BP 146/64 | HR 80 | Ht 62.0 in | Wt 185.0 lb

## 2014-04-21 DIAGNOSIS — R404 Transient alteration of awareness: Secondary | ICD-10-CM

## 2014-04-21 DIAGNOSIS — M2550 Pain in unspecified joint: Secondary | ICD-10-CM | POA: Diagnosis not present

## 2014-04-21 NOTE — Patient Instructions (Addendum)
1. Schedule routine EEG 2. Consider taking medications in the daytime 3. Discuss joint pains with your PCP 4. Follow-up in 3 months

## 2014-04-21 NOTE — Progress Notes (Signed)
NEUROLOGY CONSULTATION NOTE  Wendy Hammond MRN: [id number] DOB: August 26, 1930  Referring provider: Dr. Lajean Manes Primary care provider: Dr. Lajean Manes  Reason for consult:  Loss of consciousness  Dear Dr Felipa Eth:  Thank you for your kind referral of Wendy Hammond for consultation of the above symptoms. Although her history is well known to you, please allow me to reiterate it for the purpose of our medical record. The patient was accompanied to the clinic by her husband who also provides collateral information. Records and images were personally reviewed where available.  HISTORY OF PRESENT ILLNESS: This is a pleasant 81 year old right-handed woman with a history of dementia, depression, anxiety, in her usual state of health until 03/12/14 when they were going for a trip and he was trying to wake her up at 7am and could not arouse her. He called EMS and upon arrival she was responsive only to noxious stimuli and loud voice. She started to wake up en route to [facility] and was at baseline in the ER. She denied any symptoms and did not recall the episode. No prior similar symptoms, and no recurrent episodes since then. At the ER, CBC, CMP, urinalysis were unremarkable. I personally reviewed head CT without did not show any acute changes, there was moderate diffuse atrophy and patchy small vessel disease bilaterally. He reports that he gives her all her medications at bedtime, no change in routine. No sleep deprivation the night prior.   She denies any headaches, dizziness, diplopia, dysarthria, dysphagia, neck pain, bowel/bladder dysfunction, focal numbness/tingling/weakness. She has had 3 falls over the past 3 years, last fall was over a year ago. Her husband denies any staring/unresponsive episodes, she denies any olfactory/gustatory hallucinations, deja vu, rising epigastric sensation, myoclonic jerks.  She had a normal birth and early development.  There is no history of febrile convulsions,  CNS infections such as meningitis/encephalitis, significant traumatic brain injury, neurosurgical procedures, or family history of seizures.  PAST MEDICAL HISTORY: Past Medical History  Diagnosis Date  . Alzheimer's dementia   . Anxiety   . Depression     PAST SURGICAL HISTORY: Past Surgical History  Procedure Laterality Date  . Eye surgery    . Appendectomy      MEDICATIONS: Aricept 10mg  daily Namenda XR 28mg  daily Zoloft 50mg  daily   No current facility-administered medications on file prior to visit.    ALLERGIES: No Known Allergies  FAMILY HISTORY: No family history on file.  SOCIAL HISTORY: History   Social History  . Marital Status: Married    Spouse Name: N/A    Number of Children: N/A  . Years of Education: N/A   Occupational History  . Not on file.   Social History Main Topics  . Smoking status: Former Smoker    Quit date: 03/21/1957  . Smokeless tobacco: Not on file  . Alcohol Use: No  . Drug Use: No  . Sexual Activity: Not on file   Other Topics Concern  . Not on file   Social History Narrative    REVIEW OF SYSTEMS: Constitutional: No fevers, chills, or sweats, no generalized fatigue, change in appetite Eyes: No visual changes, double vision, eye pain Ear, nose and throat: No hearing loss, ear pain, nasal congestion, sore throat Cardiovascular: No chest pain, palpitations Respiratory:  No shortness of breath at rest or with exertion, wheezes GastrointestinaI: No nausea, vomiting, diarrhea, abdominal pain, fecal incontinence Genitourinary:  No dysuria, urinary retention or frequency Musculoskeletal:  No neck pain, +back pain, +  joint pains Integumentary: No rash, pruritus, skin lesions Neurological: as above Psychiatric: No depression, insomnia, anxiety Endocrine: No palpitations, fatigue, diaphoresis, mood swings, change in appetite, change in weight, increased thirst Hematologic/Lymphatic:  No anemia, purpura,  petechiae. Allergic/Immunologic: no itchy/runny eyes, nasal congestion, recent allergic reactions, rashes  PHYSICAL EXAM: Filed Vitals:   04/21/14 1326  BP: 146/64  Pulse: 80   General: No acute distress Head:  Normocephalic/atraumatic Eyes: Fundoscopic exam shows bilateral sharp discs, no vessel changes, exudates, or hemorrhages Neck: supple, no paraspinal tenderness, full range of motion Back: No paraspinal tenderness Heart: regular rate and rhythm Lungs: Clear to auscultation bilaterally. Vascular: No carotid bruits. Skin/Extremities: No rash, no edema Neurological Exam: Mental status: alert and oriented to person, place, no dysarthria or aphasia, Fund of knowledge is appropriate.  Remote memory intact.  Attention and concentration are normal.    Able to name objects and repeat phrases. Cranial nerves: CN I: not tested CN II: pupils equal, round and reactive to light, visual fields intact, fundi unremarkable. CN III, IV, VI:  full range of motion, no nystagmus, no ptosis CN V: facial sensation intact CN VII: upper and lower face symmetric CN VIII: hearing intact to finger rub CN IX, X: gag intact, uvula midline CN XI: sternocleidomastoid and trapezius muscles intact CN XII: tongue midline Bulk & Tone: normal, no fasciculations. Motor: 5/5 throughout with no pronator drift. Sensation: intact to light touch, cold, pin, vibration and joint position sense.  No extinction to double simultaneous stimulation.  Romberg test negative Deep Tendon Reflexes: +1 throughout, no ankle clonus Plantar responses: downgoing bilaterally Cerebellar: no incoordination on finger to nose, heel to shin. No dysdiadochokinesia Gait: narrow-based and steady, able to tandem walk adequately. Tremor: none  IMPRESSION: This is a pleasant 81 year old right-handed woman with a history of dementia, depression, anxiety, presenting after an episode where she was very difficult to arouse last 03/12/14. She was  back to baseline upon arrival to ER. Neurological exam today non-focal, head CT unremarkable. The etiology of the episode is unclear. No change in medication routine, however her husband will start giving her the medications in the morning. Routine EEG will be ordered to assess for focal abnormalities that increase risk for recurrent seizures. She does not drive. She will follow-up in 3 months.   Thank you for allowing me to participate in the care of this patient. Please do not hesitate to call for any questions or concerns.   Ellouise Newer, M.D.  CC: Dr. Felipa Eth

## 2014-04-23 ENCOUNTER — Encounter: Payer: Self-pay | Admitting: Neurology

## 2014-04-30 ENCOUNTER — Ambulatory Visit (INDEPENDENT_AMBULATORY_CARE_PROVIDER_SITE_OTHER): Payer: Medicare Other | Admitting: Neurology

## 2014-04-30 DIAGNOSIS — R404 Transient alteration of awareness: Secondary | ICD-10-CM | POA: Diagnosis not present

## 2014-05-06 NOTE — Procedures (Signed)
ELECTROENCEPHALOGRAM REPORT  Date of Study: 04/30/2014  Patient's Name: Wendy Hammond MRN: [id number] Date of Birth: June 19, 1930  Referring Provider: Dr. Ellouise Newer  Clinical History: This is an 81 year old woman with a history of dementia, depression, anxiety, presenting after an episode where she was very difficult to arouse last 03/12/14  Medications: Aricept, Namenda, Zoloft  Technical Summary: A multichannel digital EEG recording measured by the international 10-20 system with electrodes applied with paste and impedances below 5000 ohms performed in our laboratory with EKG monitoring in an awake and asleep patient.  Hyperventilation and photic stimulation were performed.  The digital EEG was referentially recorded, reformatted, and digitally filtered in a variety of bipolar and referential montages for optimal display.    Description: The patient is awake and asleep during the recording.  During maximal wakefulness, there is a poorly sustained 8 Hz posterior dominant rhythm that poorly attenuates to eye opening and eye closure. The record is symmetric.  During drowsiness and stage I sleep, there is an increase in theta slowing of the background with occasional vertex waves seen.  Hyperventilation and photic stimulation did not elicit any abnormalities.  There were no epileptiform discharges or electrographic seizures seen.    EKG lead showed sinus bradycardia at 60 bpm.  Impression: This awake and asleep EEG is normal.    Clinical Correlation: A normal EEG does not exclude a clinical diagnosis of epilepsy.  If further clinical questions remain, prolonged EEG may be helpful.  Clinical correlation is advised.   Ellouise Newer, M.D.

## 2014-05-09 ENCOUNTER — Encounter: Payer: Self-pay | Admitting: Family Medicine

## 2014-05-16 DIAGNOSIS — H59099 Other disorders of unspecified eye following cataract surgery: Secondary | ICD-10-CM | POA: Diagnosis not present

## 2014-05-16 DIAGNOSIS — Z961 Presence of intraocular lens: Secondary | ICD-10-CM | POA: Diagnosis not present

## 2014-05-19 ENCOUNTER — Telehealth: Payer: Self-pay | Admitting: Neurology

## 2014-05-19 NOTE — Telephone Encounter (Signed)
Richard pt's spouse called f/u on pt's EEG results. C/b [phone]

## 2014-05-19 NOTE — Telephone Encounter (Signed)
Left normal results on machine and per Tiffany she mailed out a copy. Advises patient if there were and questions or concerns please give [place] a call back.

## 2014-07-21 ENCOUNTER — Encounter: Payer: Self-pay | Admitting: Neurology

## 2014-07-21 ENCOUNTER — Ambulatory Visit (INDEPENDENT_AMBULATORY_CARE_PROVIDER_SITE_OTHER): Payer: Medicare Other | Admitting: Neurology

## 2014-07-21 VITALS — BP 118/76 | HR 68 | Resp 16 | Ht 62.0 in | Wt 193.0 lb

## 2014-07-21 DIAGNOSIS — R404 Transient alteration of awareness: Secondary | ICD-10-CM

## 2014-07-21 NOTE — Patient Instructions (Signed)
1. Continue all your medications 2. If symptoms recur, call our office, otherwise follow-up on an as needed basis

## 2014-07-21 NOTE — Progress Notes (Signed)
NEUROLOGY FOLLOW UP OFFICE NOTE  Wendy Hammond [id number]  HISTORY OF PRESENT ILLNESS: I had the pleasure of seeing Wendy Hammond in follow-up in the neurology clinic on 07/21/2014. She is again accompanied by her husband today. The patient was last seen 3 months ago after an episode of unresponsiveness in December 2015. Her routine EEG was normal. Since her last visit, her husband denies any further similar symptoms. She denies any headaches, dizziness, focal numbness/tingling/weakness. She had chronic back pain with difficulties in transfers. No bowel/bladder dysfunction. Her husband is her primary caregiver for her ADLs, they have a visiting aide coming 3 times a week.  HPI: This is a pleasant 81 yo RH woman with a history of dementia, depression, anxiety, in her usual state of health until 03/12/14 when they were going for a trip and he was trying to wake her up at 7am and could not arouse her. He called EMS and upon arrival she was responsive only to noxious stimuli and loud voice. She started to wake up en route to [facility] and was at baseline in the ER. She denied any symptoms and did not recall the episode. No prior similar symptoms, and no recurrent episodes since then. At the ER, CBC, CMP, urinalysis were unremarkable. I personally reviewed head CT without did not show any acute changes, there was moderate diffuse atrophy and patchy small vessel disease bilaterally. He reports that he gives her all her medications at bedtime, no change in routine. No sleep deprivation the night prior.   PAST MEDICAL HISTORY: Past Medical History  Diagnosis Date  . Alzheimer's dementia   . Anxiety   . Depression     MEDICATIONS: Current Outpatient Prescriptions on File Prior to Visit  Medication Sig Dispense Refill  . donepezil (ARICEPT) 10 MG tablet Take 10 mg by mouth at bedtime.   0  . NAMENDA XR 28 MG CP24 24 hr capsule Take 28 mg by mouth daily.   11  . sertraline (ZOLOFT) 50 MG tablet Take 50 mg  by mouth daily.   6   No current facility-administered medications on file prior to visit.    ALLERGIES: No Known Allergies  FAMILY HISTORY: No family history on file.  SOCIAL HISTORY: History   Social History  . Marital Status: Married    Spouse Name: N/A  . Number of Children: N/A  . Years of Education: N/A   Occupational History  . Not on file.   Social History Main Topics  . Smoking status: Former Smoker    Quit date: 03/21/1957  . Smokeless tobacco: Not on file  . Alcohol Use: No  . Drug Use: No  . Sexual Activity: Not on file   Other Topics Concern  . Not on file   Social History Narrative    REVIEW OF SYSTEMS: Constitutional: No fevers, chills, or sweats, no generalized fatigue, change in appetite Eyes: No visual changes, double vision, eye pain Ear, nose and throat: No hearing loss, ear pain, nasal congestion, sore throat Cardiovascular: No chest pain, palpitations Respiratory:  No shortness of breath at rest or with exertion, wheezes GastrointestinaI: No nausea, vomiting, diarrhea, abdominal pain, fecal incontinence Genitourinary:  No dysuria, urinary retention or frequency Musculoskeletal:  No neck pain, +back pain Integumentary: No rash, pruritus, skin lesions Neurological: as above Psychiatric: No depression, insomnia, anxiety Endocrine: No palpitations, fatigue, diaphoresis, mood swings, change in appetite, change in weight, increased thirst Hematologic/Lymphatic:  No anemia, purpura, petechiae. Allergic/Immunologic: no itchy/runny eyes, nasal congestion, recent allergic  reactions, rashes  PHYSICAL EXAM: Filed Vitals:   07/21/14 1507  BP: 118/76  Pulse: 68  Resp: 16   General: No acute distress Head: Normocephalic/atraumatic Eyes: Fundoscopic exam shows bilateral sharp discs, no vessel changes, exudates, or hemorrhages Neck: supple, no paraspinal tenderness, full range of motion Back: No paraspinal tenderness Heart: regular rate and  rhythm Lungs: Clear to auscultation bilaterally. Vascular: No carotid bruits. Skin/Extremities: No rash, no edema Neurological Exam: Mental status: alert and oriented to person. She does not know the date, states that we are in [place]. She does not want to say the president's name ("the one I don't like"). No dysarthria or aphasia, Fund of knowledge is appropriate. 0/3 delayed recall. Attention and concentration are normal. Able to name objects and repeat phrases. Cranial nerves: CN I: not tested CN II: pupils equal, round and reactive to light, ?left homonymous hemianopia vs neglect, fundi unremarkable. CN III, IV, VI: full range of motion, no nystagmus, no ptosis CN V: facial sensation intact CN VII: shallow left nasolabial fold CN VIII: hearing intact to finger rub CN IX, X: gag intact, uvula midline CN XI: sternocleidomastoid and trapezius muscles intact CN XII: tongue midline Bulk & Tone: normal, no fasciculations. Motor: 5/5 throughout with no pronator drift. Sensation: intact to light touch. No extinction to double simultaneous stimulation. Romberg test negative Deep Tendon Reflexes: +1 throughout, no ankle clonus Plantar responses: downgoing bilaterally Cerebellar: no incoordination on finger to nose testing Gait: slow and cautious, no ataxia Tremor: none  IMPRESSION: This is a pleasant 81 yo RH woman with a history of dementia, depression, anxiety, who had an episode where she was very difficult to arouse last 03/12/14. She was back to baseline upon arrival to ER. Head CT and routine EEG unremarkable. The etiology of the episode is unclear, no further similar symptoms since December 2015. Findings were discussed with her husband, continue current medications. If symptoms recur, prolonged EEG will be done. She will follow-up on a prn basis and her husband knows to call our office for any changes.   Thank you for allowing me to participate in her care.  Please do not  hesitate to call for any questions or concerns.  The duration of this appointment visit was 15 minutes of face-to-face time with the patient.  Greater than 50% of this time was spent in counseling, explanation of diagnosis, planning of further management, and coordination of care.   Ellouise Newer, M.D.   CC: Dr. Felipa Eth

## 2014-09-01 DIAGNOSIS — Z6841 Body Mass Index (BMI) 40.0 and over, adult: Secondary | ICD-10-CM | POA: Diagnosis not present

## 2014-09-01 DIAGNOSIS — G301 Alzheimer's disease with late onset: Secondary | ICD-10-CM | POA: Diagnosis not present

## 2014-09-01 DIAGNOSIS — F325 Major depressive disorder, single episode, in full remission: Secondary | ICD-10-CM | POA: Diagnosis not present

## 2014-11-04 DIAGNOSIS — R350 Frequency of micturition: Secondary | ICD-10-CM | POA: Diagnosis not present

## 2014-12-16 ENCOUNTER — Emergency Department (HOSPITAL_COMMUNITY): Payer: Medicare Other

## 2014-12-16 ENCOUNTER — Encounter (HOSPITAL_COMMUNITY): Payer: Self-pay | Admitting: *Deleted

## 2014-12-16 ENCOUNTER — Emergency Department (HOSPITAL_COMMUNITY)
Admission: EM | Admit: 2014-12-16 | Discharge: 2014-12-16 | Disposition: A | Discharge status: Home / Self Care | Payer: Medicare Other | Attending: Emergency Medicine | Admitting: Emergency Medicine

## 2014-12-16 DIAGNOSIS — K8020 Calculus of gallbladder without cholecystitis without obstruction: Secondary | ICD-10-CM

## 2014-12-16 DIAGNOSIS — F329 Major depressive disorder, single episode, unspecified: Secondary | ICD-10-CM | POA: Insufficient documentation

## 2014-12-16 DIAGNOSIS — Z79899 Other long term (current) drug therapy: Secondary | ICD-10-CM | POA: Diagnosis not present

## 2014-12-16 DIAGNOSIS — R1011 Right upper quadrant pain: Secondary | ICD-10-CM | POA: Diagnosis present

## 2014-12-16 DIAGNOSIS — R918 Other nonspecific abnormal finding of lung field: Secondary | ICD-10-CM | POA: Diagnosis not present

## 2014-12-16 DIAGNOSIS — Z87891 Personal history of nicotine dependence: Secondary | ICD-10-CM | POA: Diagnosis not present

## 2014-12-16 DIAGNOSIS — G309 Alzheimer's disease, unspecified: Secondary | ICD-10-CM | POA: Diagnosis not present

## 2014-12-16 DIAGNOSIS — F0280 Dementia in other diseases classified elsewhere without behavioral disturbance: Secondary | ICD-10-CM | POA: Diagnosis not present

## 2014-12-16 DIAGNOSIS — F419 Anxiety disorder, unspecified: Secondary | ICD-10-CM | POA: Diagnosis not present

## 2014-12-16 LAB — URINALYSIS, ROUTINE W REFLEX MICROSCOPIC
Bilirubin Urine: NEGATIVE
Glucose, UA: NEGATIVE mg/dL
Hgb urine dipstick: NEGATIVE
Ketones, ur: NEGATIVE mg/dL
NITRITE: NEGATIVE
Protein, ur: NEGATIVE mg/dL
SPECIFIC GRAVITY, URINE: 1.025 (ref 1.005–1.030)
UROBILINOGEN UA: 0.2 mg/dL (ref 0.0–1.0)
pH: 6 (ref 5.0–8.0)

## 2014-12-16 LAB — COMPREHENSIVE METABOLIC PANEL
ALK PHOS: 72 U/L (ref 38–126)
ALT: 12 U/L — ABNORMAL LOW (ref 14–54)
ANION GAP: 7 (ref 5–15)
AST: 22 U/L (ref 15–41)
Albumin: 4 g/dL (ref 3.5–5.0)
BILIRUBIN TOTAL: 0.9 mg/dL (ref 0.3–1.2)
BUN: 15 mg/dL (ref 6–20)
CALCIUM: 9.2 mg/dL (ref 8.9–10.3)
CO2: 28 mmol/L (ref 22–32)
Chloride: 102 mmol/L (ref 101–111)
Creatinine, Ser: 0.7 mg/dL (ref 0.44–1.00)
Glucose, Bld: 127 mg/dL — ABNORMAL HIGH (ref 65–99)
POTASSIUM: 3.4 mmol/L — AB (ref 3.5–5.1)
Sodium: 137 mmol/L (ref 135–145)
TOTAL PROTEIN: 8.1 g/dL (ref 6.5–8.1)

## 2014-12-16 LAB — CBC
HEMATOCRIT: 44.4 % (ref 36.0–46.0)
HEMOGLOBIN: 14.7 g/dL (ref 12.0–15.0)
MCH: 30.7 pg (ref 26.0–34.0)
MCHC: 33.1 g/dL (ref 30.0–36.0)
MCV: 92.7 fL (ref 78.0–100.0)
Platelets: 190 10*3/uL (ref 150–400)
RBC: 4.79 MIL/uL (ref 3.87–5.11)
RDW: 13.6 % (ref 11.5–15.5)
WBC: 11.5 10*3/uL — AB (ref 4.0–10.5)

## 2014-12-16 LAB — URINE MICROSCOPIC-ADD ON

## 2014-12-16 LAB — LIPASE, BLOOD: Lipase: 29 U/L (ref 22–51)

## 2014-12-16 MED ORDER — OXYCODONE-ACETAMINOPHEN 5-325 MG PO TABS: 2.0000 | ORAL_TABLET | ORAL | Status: DC | PRN

## 2014-12-16 MED ORDER — IOHEXOL 300 MG/ML  SOLN
100.0000 mL | Freq: Once | INTRAMUSCULAR | Status: AC | PRN
  Administered 2014-12-16: 100 mL via INTRAVENOUS

## 2014-12-16 MED ORDER — IOHEXOL 300 MG/ML  SOLN
25.0000 mL | Freq: Once | INTRAMUSCULAR | Status: AC | PRN
  Administered 2014-12-16: 25 mL via ORAL

## 2014-12-16 MED ORDER — SODIUM CHLORIDE 0.9 % IV BOLUS (SEPSIS)
1000.0000 mL | Freq: Once | INTRAVENOUS | Status: AC
  Administered 2014-12-16: 1000 mL via INTRAVENOUS

## 2014-12-16 NOTE — ED Notes (Signed)
Patient transported to CT 

## 2014-12-16 NOTE — ED Notes (Signed)
I attempted to collect labs and was unsuccessful. 

## 2014-12-16 NOTE — ED Provider Notes (Signed)
CSN: [id number]     Arrival date & time 12/16/14  1543 History   First MD Initiated Contact with Patient 12/16/14 1555     Chief Complaint  Patient presents with  . Abdominal Pain     (Consider location/radiation/quality/duration/timing/severity/associated sxs/prior Treatment) HPI Comments: Wendy Hammond is a 81 y.o F with significant dementia who presents to the emergency department today via EMS from her nursing home to be evaluated for abdominal pain. History is limited by patient's condition, Alzheimer's dementia. Patient was accompanied by spouse. Patient currently without complaint and symptom free. Per patient's spouse over the last 3 days patient has been complaining of right upper quadrant abdominal pain. Last night patient was screaming in pain. Denies dysuria, abdominal pain, fevers, vomiting. Patient is able to ambulate with assistance, this is her baseline. Patient sposue states patient's last bowel movement was yesterday.   Patient is a 81 y.o. female presenting with abdominal pain. The history is provided by the patient and the spouse. The history is limited by the condition of the patient.  Abdominal Pain   Past Medical History  Diagnosis Date  . Alzheimer's dementia   . Anxiety   . Depression    Past Surgical History  Procedure Laterality Date  . Eye surgery    . Appendectomy     No family history on file. Social History  Substance Use Topics  . Smoking status: Former Smoker    Quit date: 03/21/1957  . Smokeless tobacco: None  . Alcohol Use: No   OB History    No data available     Review of Systems  Gastrointestinal: Positive for abdominal pain.      Allergies  Review of patient's allergies indicates no known allergies.  Home Medications   Prior to Admission medications   Medication Sig Start Date End Date Taking? Authorizing Provider  CALCIUM CARBONATE PO Take 1 tablet by mouth daily.   Yes Historical Provider, MD  donepezil (ARICEPT) 10 MG  tablet Take 10 mg by mouth at bedtime.  01/12/14  Yes Historical Provider, MD  NAMENDA XR 28 MG CP24 24 hr capsule Take 28 mg by mouth at bedtime.  04/17/14  Yes Historical Provider, MD  sertraline (ZOLOFT) 50 MG tablet Take 25 mg by mouth daily.  03/23/14  Yes Historical Provider, MD   BP 118/57 mmHg  Pulse 79  Temp(Src) 98 F (36.7 C) (Oral)  Resp 16  SpO2 90% Physical Exam  Constitutional: She appears well-developed and well-nourished. No distress.  HENT:  Head: Normocephalic and atraumatic.  Mouth/Throat: No oropharyngeal exudate.  Eyes: Conjunctivae and EOM are normal. Pupils are equal, round, and reactive to light. Right eye exhibits no discharge. Left eye exhibits no discharge. No scleral icterus.  Neck:  No meningismus  Cardiovascular: Normal rate, regular rhythm, normal heart sounds and intact distal pulses.  Exam reveals no gallop and no friction rub.   No murmur heard. Pulmonary/Chest: Effort normal and breath sounds normal. No respiratory distress. She has no wheezes. She has no rales. She exhibits no tenderness.  Abdominal: Soft. Bowel sounds are normal. She exhibits no distension and no mass. There is no tenderness. There is no rebound and no guarding.  Negative Murphy's sign.  Musculoskeletal: Normal range of motion. She exhibits no edema.  Neurological: She is alert.  Strength 5/5 throughout. No sensory deficits.    Skin: Skin is warm and dry. No rash noted. She is not diaphoretic. No erythema. No pallor.  Psychiatric:  Pt wits Alzheimer's dementia.  Pleasant. Not oriented. Alert.  Nursing note and vitals reviewed.   ED Course  Procedures (including critical care time) Labs Review Labs Reviewed  COMPREHENSIVE METABOLIC PANEL - Abnormal; Notable for the following:    Potassium 3.4 (*)    Glucose, Bld 127 (*)    ALT 12 (*)    All other components within normal limits  CBC - Abnormal; Notable for the following:    WBC 11.5 (*)    All other components within normal  limits  URINALYSIS, ROUTINE W REFLEX MICROSCOPIC (NOT AT [facility]) - Abnormal; Notable for the following:    Color, Urine AMBER (*)    Leukocytes, UA TRACE (*)    All other components within normal limits  LIPASE, BLOOD  URINE MICROSCOPIC-ADD ON    Imaging Review Ct Chest Wo Contrast  12/16/2014   CLINICAL DATA:  Airspace opacities. Abnormality seen on CT of the abdomen and pelvis.  EXAM: CT CHEST WITHOUT CONTRAST  TECHNIQUE: Multidetector CT imaging of the chest was performed following the standard protocol without IV contrast.  COMPARISON:  12/16/2014 CT  FINDINGS: Heart: Significant calcification of the coronary arteries and aortic valve. Heart is mildly enlarged. No pericardial effusion.  Vascular structures: Atherosclerotic calcification of the thoracic aorta. Tortuosity of the great vessels. No aneurysm.  Mediastinum/thyroid: The visualized portion of the thyroid gland has a normal appearance. Small, nonspecific mediastinal lymph nodes are identified. These may be reactive. Small precarinal lymph node is 0.8 cm. Left diaphragmatic lymph node is 0.8 cm.  Lungs/Airways: There is airspace disease identified at the lung bases bilaterally, right greater than left. Small right pleural effusion better seen on CT of the abdomen. Associated air bronchograms identified. No significant pulmonary edema. There are no suspicious pulmonary nodules.  Upper abdomen: 0.7 cm nodule identified at the dome of the left hepatic lobe, nonspecific in appearance. A 1.0 cm splenic artery aneurysm again seen.  Chest wall/osseous structures: Ankylosis of the mid thoracic spine. No suspicious lytic or blastic lesions are identified.  IMPRESSION: 1. Airspace disease identified at the lung bases, right greater than left. Findings favor infectious process. Follow-up chest x-ray recommended to document clearing in 3-4 weeks. 2. Significant coronary artery disease.  Mild cardiomegaly. 3. Atherosclerotic calcification of the thoracic  aorta. 4. Nonspecific mediastinal lymph nodes may be reactive. 5. Nonspecific left hepatic nodule, favored to be benign. 6. 1 cm splenic artery aneurysm.   Electronically Signed   By: Nolon Nations M.D.   On: 12/16/2014 19:48   Ct Abdomen Pelvis W Contrast  12/16/2014   CLINICAL DATA:  Right upper quadrant abdominal pain with dark colored urine for 1 day. History of appendectomy and dementia. Initial encounter.  EXAM: CT ABDOMEN AND PELVIS WITH CONTRAST  TECHNIQUE: Multidetector CT imaging of the abdomen and pelvis was performed using the standard protocol following bolus administration of intravenous contrast.  CONTRAST:  26mL OMNIPAQUE IOHEXOL 300 MG/ML SOLN, 12mL OMNIPAQUE IOHEXOL 300 MG/ML SOLN  COMPARISON:  None.  FINDINGS: Lower chest: There are dependent airspace opacities at both lung bases and a small right pleural effusion. No significant pericardial effusion. Small hiatal hernia noted.  Hepatobiliary: Probable 7 mm cyst centrally in the liver on image 8. No suspicious hepatic findings. There are several calcified gallstones. The gallbladder is incompletely distended and demonstrates borderline wall thickening, although no surrounding inflammation. There is no biliary dilatation.  Pancreas: Unremarkable. No pancreatic ductal dilatation or surrounding inflammatory changes.  Spleen: Normal in size without focal abnormality.  Adrenals/Urinary Tract: Both  adrenal glands appear normal. There are several low density renal lesions bilaterally, too small to optimally characterize, but likely cysts. No worrisome renal findings. No evidence of urinary tract calculus or hydronephrosis. The bladder appears unremarkable.  Stomach/Bowel: No evidence of bowel wall thickening, distention or surrounding inflammatory change. There are moderate sigmoid colon diverticular changes. Retrocecal surgical clips consistent with previous appendectomy.  Vascular/Lymphatic: There are no enlarged abdominal or pelvic lymph nodes.  Moderate aortoiliac atherosclerosis. There is a 1 cm splenic artery aneurysm.  Reproductive: Unremarkable.  Other: Small umbilical hernia containing only fat.  Musculoskeletal: No acute or significant osseous findings. Mild lumbar spondylosis.  IMPRESSION: 1. Cholelithiasis with borderline gallbladder wall thickening, but no CT evidence of acute cholecystitis or biliary dilatation. 2. Dependent airspace opacities at both lung bases with small right pleural effusion. These findings could be secondary to aspiration. Chest radiographic correlation suggested. 3. Sigmoid diverticulosis without acute inflammation. 4. Moderate atherosclerosis.   Electronically Signed   By: Richardean Sale M.D.   On: 12/16/2014 18:32   I have personally reviewed and evaluated these images and lab results as part of my medical decision-making.   EKG Interpretation None      MDM   Final diagnoses:  Calculus of gallbladder without cholecystitis without obstruction   Pt presents for evaluation of reported abdominal pain. Pt currently without complaints. Pt has severe dementia and is unable to quantify her pain.  UA neg for infection. Unable to clinically correlate pain due to pts limited history and dementia. Will CT abd. CT revealed cholelithiasis with borderline gallbladder wall thickening. No evidence of acute cholecystitis. Evidence of small pleural effusion and airspace opacities and lungs recommend follow-up chest radiograph to correlate.  CT chest reveals airspace disease identified in the lung bases, right greater than left. Findings favor infectious process. Clinically patient does not appear to have active lung infection. No productive cough, no fever, no work of breathing. Will defer antibiotic at this time patient given strict return precautions which was instructed to patient's son. Recommend follow-up chest x-ray in 3-4 weeks.  Vital signs stable. Patient stable for discharge. Discussion with plan with  patient's family and patient who are agreeable. Return precautions outlined in patient discharge instructions. Also given follow-up with general surgery for consult of cholelithiasis.      Dondra Spry [place], PA-C 12/16/14 2241  Leo Grosser, MD 12/17/14 [phone]

## 2014-12-16 NOTE — Discharge Instructions (Signed)
Biliary Colic  °Biliary colic is a steady or irregular pain in the upper abdomen. It is usually under the right side of the rib cage. It happens when gallstones interfere with the normal flow of bile from the gallbladder. Bile is a liquid that helps to digest fats. Bile is made in the liver and stored in the gallbladder. When you eat a meal, bile passes from the gallbladder through the cystic duct and the common bile duct into the small intestine. There, it mixes with partially digested food. If a gallstone blocks either of these ducts, the normal flow of bile is blocked. The muscle cells in the bile duct contract forcefully to try to move the stone. This causes the pain of biliary colic.  °SYMPTOMS  °· A person with biliary colic usually complains of pain in the upper abdomen. This pain can be: °· In the center of the upper abdomen just below the breastbone. °· In the upper-right part of the abdomen, near the gallbladder and liver. °· Spread back toward the right shoulder blade. °· Nausea and vomiting. °· The pain usually occurs after eating. °· Biliary colic is usually triggered by the digestive system's demand for bile. The demand for bile is high after fatty meals. Symptoms can also occur when a person who has been fasting suddenly eats a very large meal. Most episodes of biliary colic pass after 1 to 5 hours. After the most intense pain passes, your abdomen may continue to ache mildly for about 24 hours. °DIAGNOSIS  °After you describe your symptoms, your caregiver will perform a physical exam. He or she will pay attention to the upper right portion of your belly (abdomen). This is the area of your liver and gallbladder. An ultrasound will help your caregiver look for gallstones. Specialized scans of the gallbladder may also be done. Blood tests may be done, especially if you have fever or if your pain persists. °PREVENTION  °Biliary colic can be prevented by controlling the risk factors for gallstones. Some of  these risk factors, such as heredity, increasing age, and pregnancy are a normal part of life. Obesity and a high-fat diet are risk factors you can change through a healthy lifestyle. Women going through menopause who take hormone replacement therapy (estrogen) are also more likely to develop biliary colic. °TREATMENT  °· Pain medication may be prescribed. °· You may be encouraged to eat a fat-free diet. °· If the first episode of biliary colic is severe, or episodes of colic keep retuning, surgery to remove the gallbladder (cholecystectomy) is usually recommended. This procedure can be done through small incisions using an instrument called a laparoscope. The procedure often requires a brief stay in the hospital. Some people can leave the hospital the same day. It is the most widely used treatment in people troubled by painful gallstones. It is effective and safe, with no complications in more than 90% of cases. °· If surgery cannot be done, medication that dissolves gallstones may be used. This medication is expensive and can take months or years to work. Only small stones will dissolve. °· Rarely, medication to dissolve gallstones is combined with a procedure called shock-wave lithotripsy. This procedure uses carefully aimed shock waves to break up gallstones. In many people treated with this procedure, gallstones form again within a few years. °PROGNOSIS  °If gallstones block your cystic duct or common bile duct, you are at risk for repeated episodes of biliary colic. There is also a 25% chance that you will develop   a gallbladder infection(acute cholecystitis), or some other complication of gallstones within 10 to 20 years. If you have surgery, schedule it at a time that is convenient for you and at a time when you are not sick. °HOME CARE INSTRUCTIONS  °· Drink plenty of clear fluids. °· Avoid fatty, greasy or fried foods, or any foods that make your pain worse. °· Take medications as directed. °SEEK MEDICAL  CARE IF:  °· You develop a fever over 100.5° F (38.1° C). °· Your pain gets worse over time. °· You develop nausea that prevents you from eating and drinking. °· You develop vomiting. °SEEK IMMEDIATE MEDICAL CARE IF:  °· You have continuous or severe belly (abdominal) pain which is not relieved with medications. °· You develop nausea and vomiting which is not relieved with medications. °· You have symptoms of biliary colic and you suddenly develop a fever and shaking chills. This may signal cholecystitis. Call your caregiver immediately. °· You develop a yellow color to your skin or the white part of your eyes (jaundice). °Document Released: 08/08/2005 Document Revised: 05/30/2011 Document Reviewed: 10/18/2007 °[employer]® Patient Information ©2015 [employer]. This information is not intended to replace advice given to you by your health care provider. Make sure you discuss any questions you have with your health care provider. ° °Cholelithiasis °Cholelithiasis (also called gallstones) is a form of gallbladder disease in which gallstones form in your gallbladder. The gallbladder is an organ that stores bile made in the liver, which helps digest fats. Gallstones begin as small crystals and slowly grow into stones. Gallstone pain occurs when the gallbladder spasms and a gallstone is blocking the duct. Pain can also occur when a stone passes out of the duct.  °RISK FACTORS °· Being female.   °· Having multiple pregnancies. Health care providers sometimes advise removing diseased gallbladders before future pregnancies.   °· Being obese. °· Eating a diet heavy in fried foods and fat.   °· Being older than 60 years and increasing age.   °· Prolonged use of medicines containing female hormones.   °· Having diabetes mellitus.   °· Rapidly losing weight.   °· Having a family history of gallstones (heredity).   °SYMPTOMS °· Nausea.   °· Vomiting. °· Abdominal pain.   °· Yellowing of the skin (jaundice).   °· Sudden pain. It  may persist from several minutes to several hours. °· Fever.   °· Tenderness to the touch.  °In some cases, when gallstones do not move into the bile duct, people have no pain or symptoms. These are called "silent" gallstones.  °TREATMENT °Silent gallstones do not need treatment. In severe cases, emergency surgery may be required. Options for treatment include: °· Surgery to remove the gallbladder. This is the most common treatment. °· Medicines. These do not always work and may take 6-12 months or more to work. °· Shock wave treatment (extracorporeal biliary lithotripsy). In this treatment an ultrasound machine sends shock waves to the gallbladder to break gallstones into smaller pieces that can pass into the intestines or be dissolved by medicine. °HOME CARE INSTRUCTIONS  °· Only take over-the-counter or prescription medicines for pain, discomfort, or fever as directed by your health care provider.   °· Follow a low-fat diet until seen again by your health care provider. Fat causes the gallbladder to contract, which can result in pain.   °· Follow up with your health care provider as directed. Attacks are almost always recurrent and surgery is usually required for permanent treatment.   °SEEK IMMEDIATE MEDICAL CARE IF:  °· Your pain increases and is   not controlled by medicines.   You have a fever or persistent symptoms for more than 2-3 days.   You have a fever and your symptoms suddenly get worse.   You have persistent nausea and vomiting.  MAKE SURE YOU:   Understand these instructions.  Will watch your condition.  Will get help right away if you are not doing well or get worse. Document Released: 03/03/2005 Document Revised: 11/07/2012 Document Reviewed: 08/29/2012 [facility] Patient Information 2015 [place], [place]. This information is not intended to replace advice given to you by your health care provider. Make sure you discuss any questions you have with your health care  provider.  Follow-up with PCP in 3-4 weeks for repeat chest x-ray. Follow-up with general surgery outpatient for evaluation for cholelithiasis. Return to the emergency department if you experience productive cough, fever, difficulty breathing, chest pain, worsening of symptoms. Avoid fatty foods.

## 2014-12-16 NOTE — ED Notes (Signed)
Family and patient notified that CT scan is back and simply needs to be read by the MD/PA. They understood.

## 2014-12-16 NOTE — ED Notes (Signed)
Bed: WA20 Expected date:  Expected time:  Means of arrival:  Comments: EMS- 81yo, abdominal pain/dark urine

## 2014-12-16 NOTE — ED Notes (Addendum)
PA at bedside.

## 2014-12-16 NOTE — ED Notes (Signed)
Per EMS, pt from [facility].  Per pt's husband, pt started to c/o RUQ pain yesterday with "dark colored urine."  Pt with dementia.

## 2014-12-19 DIAGNOSIS — K8020 Calculus of gallbladder without cholecystitis without obstruction: Secondary | ICD-10-CM | POA: Diagnosis not present

## 2015-01-07 DIAGNOSIS — K8020 Calculus of gallbladder without cholecystitis without obstruction: Secondary | ICD-10-CM | POA: Diagnosis not present

## 2015-01-24 ENCOUNTER — Emergency Department (HOSPITAL_COMMUNITY)
Admission: EM | Admit: 2015-01-24 | Discharge: 2015-01-24 | Disposition: A | Discharge status: Home / Self Care | Payer: Medicare Other | Attending: Emergency Medicine | Admitting: Emergency Medicine

## 2015-01-24 DIAGNOSIS — F329 Major depressive disorder, single episode, unspecified: Secondary | ICD-10-CM | POA: Insufficient documentation

## 2015-01-24 DIAGNOSIS — G309 Alzheimer's disease, unspecified: Secondary | ICD-10-CM | POA: Diagnosis not present

## 2015-01-24 DIAGNOSIS — Z87891 Personal history of nicotine dependence: Secondary | ICD-10-CM | POA: Diagnosis not present

## 2015-01-24 DIAGNOSIS — F419 Anxiety disorder, unspecified: Secondary | ICD-10-CM | POA: Insufficient documentation

## 2015-01-24 DIAGNOSIS — R197 Diarrhea, unspecified: Secondary | ICD-10-CM | POA: Diagnosis not present

## 2015-01-24 DIAGNOSIS — R404 Transient alteration of awareness: Secondary | ICD-10-CM | POA: Diagnosis not present

## 2015-01-24 DIAGNOSIS — R1111 Vomiting without nausea: Secondary | ICD-10-CM

## 2015-01-24 DIAGNOSIS — R55 Syncope and collapse: Secondary | ICD-10-CM | POA: Diagnosis not present

## 2015-01-24 DIAGNOSIS — Z79899 Other long term (current) drug therapy: Secondary | ICD-10-CM | POA: Diagnosis not present

## 2015-01-24 DIAGNOSIS — R112 Nausea with vomiting, unspecified: Secondary | ICD-10-CM | POA: Diagnosis not present

## 2015-01-24 DIAGNOSIS — F0280 Dementia in other diseases classified elsewhere without behavioral disturbance: Secondary | ICD-10-CM | POA: Diagnosis not present

## 2015-01-24 LAB — CBC
HCT: 45.9 % (ref 36.0–46.0)
HEMOGLOBIN: 14.6 g/dL (ref 12.0–15.0)
MCH: 29.9 pg (ref 26.0–34.0)
MCHC: 31.8 g/dL (ref 30.0–36.0)
MCV: 94.1 fL (ref 78.0–100.0)
PLATELETS: 159 10*3/uL (ref 150–400)
RBC: 4.88 MIL/uL (ref 3.87–5.11)
RDW: 14.3 % (ref 11.5–15.5)
WBC: 11.4 10*3/uL — AB (ref 4.0–10.5)

## 2015-01-24 LAB — URINALYSIS, ROUTINE W REFLEX MICROSCOPIC
BILIRUBIN URINE: NEGATIVE
GLUCOSE, UA: NEGATIVE mg/dL
Hgb urine dipstick: NEGATIVE
KETONES UR: NEGATIVE mg/dL
LEUKOCYTES UA: NEGATIVE
NITRITE: NEGATIVE
Protein, ur: NEGATIVE mg/dL
Specific Gravity, Urine: 1.02 (ref 1.005–1.030)
Urobilinogen, UA: 0.2 mg/dL (ref 0.0–1.0)
pH: 5.5 (ref 5.0–8.0)

## 2015-01-24 LAB — HEPATIC FUNCTION PANEL
ALBUMIN: 3.6 g/dL (ref 3.5–5.0)
ALT: 17 U/L (ref 14–54)
AST: 27 U/L (ref 15–41)
Alkaline Phosphatase: 72 U/L (ref 38–126)
Bilirubin, Direct: <0.1 mg/dL — ABNORMAL LOW (ref 0.1–0.5)
TOTAL PROTEIN: 6.8 g/dL (ref 6.5–8.1)
Total Bilirubin: 0.8 mg/dL (ref 0.3–1.2)

## 2015-01-24 LAB — LIPASE, BLOOD: LIPASE: 36 U/L (ref 11–51)

## 2015-01-24 LAB — BASIC METABOLIC PANEL
ANION GAP: 14 (ref 5–15)
BUN: 9 mg/dL (ref 6–20)
CALCIUM: 9.5 mg/dL (ref 8.9–10.3)
CO2: 26 mmol/L (ref 22–32)
Chloride: 101 mmol/L (ref 101–111)
Creatinine, Ser: 0.87 mg/dL (ref 0.44–1.00)
GFR calc non Af Amer: >60 mL/min (ref >60)
Glucose, Bld: 155 mg/dL — ABNORMAL HIGH (ref 65–99)
Potassium: 3.9 mmol/L (ref 3.5–5.1)
Sodium: 141 mmol/L (ref 135–145)

## 2015-01-24 MED ORDER — ONDANSETRON 4 MG PO TBDP: ORAL_TABLET | ORAL | Status: DC

## 2015-01-24 NOTE — Discharge Instructions (Signed)
Syncope °Syncope is a medical term for fainting or passing out. This means you lose consciousness and drop to the ground. People are generally unconscious for less than 5 minutes. You may have some muscle twitches for up to 15 seconds before waking up and returning to normal. Syncope occurs more often in older adults, but it can happen to anyone. While most causes of syncope are not dangerous, syncope can be a sign of a serious medical problem. It is important to seek medical care.  °CAUSES  °Syncope is caused by a sudden drop in blood flow to the brain. The specific cause is often not determined. Factors that can bring on syncope include: °· Taking medicines that lower blood pressure. °· Sudden changes in posture, such as standing up quickly. °· Taking more medicine than prescribed. °· Standing in one place for too long. °· Seizure disorders. °· Dehydration and excessive exposure to heat. °· Low blood sugar (hypoglycemia). °· Straining to have a bowel movement. °· Heart disease, irregular heartbeat, or other circulatory problems. °· Fear, emotional distress, seeing blood, or severe pain. °SYMPTOMS  °Right before fainting, you may: °· Feel dizzy or light-headed. °· Feel nauseous. °· See all white or all black in your field of vision. °· Have cold, clammy skin. °DIAGNOSIS  °Your health care provider will ask about your symptoms, perform a physical exam, and perform an electrocardiogram (ECG) to record the electrical activity of your heart. Your health care provider may also perform other heart or blood tests to determine the cause of your syncope which may include: °· Transthoracic echocardiogram (TTE). During echocardiography, sound waves are used to evaluate how blood flows through your heart. °· Transesophageal echocardiogram (TEE). °· Cardiac monitoring. This allows your health care provider to monitor your heart rate and rhythm in real time. °· Holter monitor. This is a portable device that records your  heartbeat and can help diagnose heart arrhythmias. It allows your health care provider to track your heart activity for several days, if needed. °· Stress tests by exercise or by giving medicine that makes the heart beat faster. °TREATMENT  °In most cases, no treatment is needed. Depending on the cause of your syncope, your health care provider may recommend changing or stopping some of your medicines. °HOME CARE INSTRUCTIONS °· Have someone stay with you until you feel stable. °· Do not drive, use machinery, or play sports until your health care provider says it is okay. °· Keep all follow-up appointments as directed by your health care provider. °· Lie down right away if you start feeling like you might faint. Breathe deeply and steadily. Wait until all the symptoms have passed. °· Drink enough fluids to keep your urine clear or pale yellow. °· If you are taking blood pressure or heart medicine, get up slowly and take several minutes to sit and then stand. This can reduce dizziness. °SEEK IMMEDIATE MEDICAL CARE IF:  °· You have a severe headache. °· You have unusual pain in the chest, abdomen, or back. °· You are bleeding from your mouth or rectum, or you have black or tarry stool. °· You have an irregular or very fast heartbeat. °· You have pain with breathing. °· You have repeated fainting or seizure-like jerking during an episode. °· You faint when sitting or lying down. °· You have confusion. °· You have trouble walking. °· You have severe weakness. °· You have vision problems. °If you fainted, call your local emergency services (911 in [place] Do not drive   yourself to the hospital.    This information is not intended to replace advice given to you by your health care provider. Make sure you discuss any questions you have with your health care provider.   Document Released: 03/07/2005 Document Revised: 07/22/2014 Document Reviewed: 05/06/2011 Elsevier Interactive Patient Education 2016 [employer]. Suspected Viral Gastroenteritis Viral gastroenteritis is also known as stomach flu. This condition affects the stomach and intestinal tract. It can cause sudden diarrhea and vomiting. The illness typically lasts 3 to 8 days. Most people develop an immune response that eventually gets rid of the virus. While this natural response develops, the virus can make you quite ill. CAUSES  Many different viruses can cause gastroenteritis, such as rotavirus or noroviruses. You can catch one of these viruses by consuming contaminated food or water. You may also catch a virus by sharing utensils or other personal items with an infected person or by touching a contaminated surface. SYMPTOMS  The most common symptoms are diarrhea and vomiting. These problems can cause a severe loss of body fluids (dehydration) and a body salt (electrolyte) imbalance. Other symptoms may include:  Fever.  Headache.  Fatigue.  Abdominal pain. DIAGNOSIS  Your caregiver can usually diagnose viral gastroenteritis based on your symptoms and a physical exam. A stool sample may also be taken to test for the presence of viruses or other infections. TREATMENT  This illness typically goes away on its own. Treatments are aimed at rehydration. The most serious cases of viral gastroenteritis involve vomiting so severely that you are not able to keep fluids down. In these cases, fluids must be given through an intravenous line (IV). HOME CARE INSTRUCTIONS   Drink enough fluids to keep your urine clear or pale yellow. Drink small amounts of fluids frequently and increase the amounts as tolerated.  Ask your caregiver for specific rehydration instructions.  Avoid:  Foods high in sugar.  Alcohol.  Carbonated drinks.  Tobacco.  Juice.  Caffeine drinks.  Extremely hot or cold fluids.  Fatty, greasy foods.  Too much intake of anything at one time.  Dairy products until 24 to 48 hours after diarrhea stops.  You may  consume probiotics. Probiotics are active cultures of beneficial bacteria. They may lessen the amount and number of diarrheal stools in adults. Probiotics can be found in yogurt with active cultures and in supplements.  Wash your hands well to avoid spreading the virus.  Only take over-the-counter or prescription medicines for pain, discomfort, or fever as directed by your caregiver. Do not give aspirin to children. Antidiarrheal medicines are not recommended.  Ask your caregiver if you should continue to take your regular prescribed and over-the-counter medicines.  Keep all follow-up appointments as directed by your caregiver. SEEK IMMEDIATE MEDICAL CARE IF:   You are unable to keep fluids down.  You do not urinate at least once every 6 to 8 hours.  You develop shortness of breath.  You notice blood in your stool or vomit. This may look like coffee grounds.  You have abdominal pain that increases or is concentrated in one small area (localized).  You have persistent vomiting or diarrhea.  You have a fever.  The patient is a child younger than 3 months, and he or she has a fever.  The patient is a child older than 3 months, and he or she has a fever and persistent symptoms.  The patient is a child older than 3 months, and he or she has a fever and  symptoms suddenly get worse.  The patient is a baby, and he or she has no tears when crying. MAKE SURE YOU:   Understand these instructions.  Will watch your condition.  Will get help right away if you are not doing well or get worse.   This information is not intended to replace advice given to you by your health care provider. Make sure you discuss any questions you have with your health care provider.   Document Released: 03/07/2005 Document Revised: 05/30/2011 Document Reviewed: 12/22/2010 Elsevier Interactive Patient Education [employer].

## 2015-01-24 NOTE — ED Notes (Signed)
Pt reports to the ED for eval of syncopal episode, emesis, and incontinence of stool and urine. Pt has hx of syncope. Pupils pinpoint. Per EMS they could not locate any narcotics. 12 lead en route unremarkable. VSS en route as well. CBG 157 mg/dl. Pt denies any pain at this time. Large amount of stool and emesis on scene. Pt alert and oriented at baseline. Resp e/u and skin warm and dry.

## 2015-01-24 NOTE — ED Provider Notes (Signed)
CSN: [id number]     Arrival date & time 01/24/15  1545 History   First MD Initiated Contact with Patient 01/24/15 1615     Chief Complaint  Patient presents with  . Loss of Consciousness     (Consider location/radiation/quality/duration/timing/severity/associated sxs/prior Treatment) HPI Patient has Alzheimer's dementia. Her husband is her caregiver. He reports that she was in the shower when she reported not feeling well. He states that she got weak and had both vomiting and diarrhea occur at the same time. He reports she sank to her knees in the shower, he was able to assist her and she did not get injured as he lowered her to the ground. He reports that she awakened to be at baseline mental status, and at this time she reports feeling somewhat unwell  but does not localize any pain. EMS report is that there was a large amount of both stool and emesis on scene. The patient's husband reports she has a somewhat irregular diet and had eaten before getting in the shower. He denies she's had fever or recent symptoms of vomiting and diarrhea. Past Medical History  Diagnosis Date  . Alzheimer's dementia   . Anxiety   . Depression    Past Surgical History  Procedure Laterality Date  . Eye surgery    . Appendectomy     No family history on file. Social History  Substance Use Topics  . Smoking status: Former Smoker    Quit date: 03/21/1957  . Smokeless tobacco: Not on file  . Alcohol Use: No   OB History    No data available     Review of Systems 10 Systems reviewed and are negative for acute change except as noted in the HPI.    Allergies  Review of patient's allergies indicates no known allergies.  Home Medications   Prior to Admission medications   Medication Sig Start Date End Date Taking? Authorizing Provider  CALCIUM PO Take 1 tablet by mouth at bedtime.   Yes Historical Provider, MD  donepezil (ARICEPT) 10 MG tablet Take 10 mg by mouth at bedtime.  01/12/14  Yes  Historical Provider, MD  memantine (NAMENDA XR) 28 MG CP24 24 hr capsule Take 28 mg by mouth at bedtime.   Yes Historical Provider, MD  sertraline (ZOLOFT) 50 MG tablet Take 25 mg by mouth at bedtime.  03/23/14  Yes Historical Provider, MD  ursodiol (ACTIGALL) 300 MG capsule Take 300 mg by mouth 3 (three) times daily. 01/11/15  Yes Historical Provider, MD  ondansetron (ZOFRAN ODT) 4 MG disintegrating tablet 4mg  ODT q4 hours prn nausea/vomit 01/24/15   Charlesetta Shanks, MD  oxyCODONE-acetaminophen (PERCOCET/ROXICET) 5-325 MG tablet Take 2 tablets by mouth every 4 (four) hours as needed for severe pain. Patient not taking: Reported on 01/24/2015 12/16/14   Samantha Tripp Dowless, PA-C   BP 120/52 mmHg  Pulse 73  Temp(Src) 97.7 F (36.5 C) (Oral)  Resp 15  SpO2 95% Physical Exam  Constitutional: She appears well-developed and well-nourished.  HENT:  Head: Normocephalic and atraumatic.  Eyes: EOM are normal. Pupils are equal, round, and reactive to light.  Neck: Neck supple.  Cardiovascular: Normal rate, regular rhythm, normal heart sounds and intact distal pulses.   Pulmonary/Chest: Effort normal and breath sounds normal.  Abdominal: Soft. Bowel sounds are normal. She exhibits no distension. There is no tenderness.  Musculoskeletal: Normal range of motion. She exhibits no edema or tenderness.  Patient can elevate both lower extremities of the bed. She can  flex and extend. There are no effusions or deformities of the extremities. She does have a linear scratch on the left knee. This is approximate 6 cm in length. No other traumatic injury identified.  Neurological: She is alert. She has normal strength. Coordination normal. GCS eye subscore is 4. GCS verbal subscore is 5. GCS motor subscore is 6.  Skin: Skin is warm, dry and intact.  Psychiatric: She has a normal mood and affect.    ED Course  Procedures (including critical care time) Labs Review Labs Reviewed  BASIC METABOLIC PANEL -  Abnormal; Notable for the following:    Glucose, Bld 155 (*)    All other components within normal limits  CBC - Abnormal; Notable for the following:    WBC 11.4 (*)    All other components within normal limits  HEPATIC FUNCTION PANEL - Abnormal; Notable for the following:    Bilirubin, Direct <0.1 (*)    All other components within normal limits  URINALYSIS, ROUTINE W REFLEX MICROSCOPIC (NOT AT [facility]) - Abnormal; Notable for the following:    Color, Urine AMBER (*)    APPearance CLOUDY (*)    All other components within normal limits  LIPASE, BLOOD    Imaging Review No results found. I have personally reviewed and evaluated these images and lab results as part of my medical decision-making.   EKG Interpretation None      MDM   Final diagnoses:  Syncope and collapse  Diarrhea, unspecified type  Non-intractable vomiting without nausea, vomiting of unspecified type    The patient had episode of vomiting and diarrhea with syncopal episode. At this time I suspect a gastroenteritis type of presentation with vasovagal syncope. Patient's vital signs are stable. She is not clinically ill in appearance. Patient does not have abdominal pain. At this point she will be managed at home by her family members. They're counseled on signs and symptoms for which to return.    Charlesetta Shanks, MD 01/24/15 [phone]

## 2015-02-07 DIAGNOSIS — Z23 Encounter for immunization: Secondary | ICD-10-CM | POA: Diagnosis not present

## 2015-03-25 DIAGNOSIS — R197 Diarrhea, unspecified: Secondary | ICD-10-CM | POA: Diagnosis not present

## 2015-03-25 DIAGNOSIS — F325 Major depressive disorder, single episode, in full remission: Secondary | ICD-10-CM | POA: Diagnosis not present

## 2015-03-25 DIAGNOSIS — G301 Alzheimer's disease with late onset: Secondary | ICD-10-CM | POA: Diagnosis not present

## 2015-03-25 DIAGNOSIS — Z0000 Encounter for general adult medical examination without abnormal findings: Secondary | ICD-10-CM | POA: Diagnosis not present

## 2015-03-25 DIAGNOSIS — Z79899 Other long term (current) drug therapy: Secondary | ICD-10-CM | POA: Diagnosis not present

## 2015-05-05 DIAGNOSIS — F325 Major depressive disorder, single episode, in full remission: Secondary | ICD-10-CM | POA: Diagnosis not present

## 2015-05-05 DIAGNOSIS — G301 Alzheimer's disease with late onset: Secondary | ICD-10-CM | POA: Diagnosis not present

## 2015-05-05 DIAGNOSIS — K921 Melena: Secondary | ICD-10-CM | POA: Diagnosis not present

## 2015-05-05 DIAGNOSIS — B372 Candidiasis of skin and nail: Secondary | ICD-10-CM | POA: Diagnosis not present

## 2015-05-27 DIAGNOSIS — K8020 Calculus of gallbladder without cholecystitis without obstruction: Secondary | ICD-10-CM | POA: Diagnosis not present

## 2015-08-31 DIAGNOSIS — G301 Alzheimer's disease with late onset: Secondary | ICD-10-CM | POA: Diagnosis not present

## 2015-08-31 DIAGNOSIS — Z6841 Body Mass Index (BMI) 40.0 and over, adult: Secondary | ICD-10-CM | POA: Diagnosis not present

## 2015-10-29 DIAGNOSIS — H5212 Myopia, left eye: Secondary | ICD-10-CM | POA: Diagnosis not present

## 2015-10-29 DIAGNOSIS — H353191 Nonexudative age-related macular degeneration, unspecified eye, early dry stage: Secondary | ICD-10-CM | POA: Diagnosis not present

## 2015-10-29 DIAGNOSIS — Z961 Presence of intraocular lens: Secondary | ICD-10-CM | POA: Diagnosis not present

## 2015-10-29 DIAGNOSIS — H04129 Dry eye syndrome of unspecified lacrimal gland: Secondary | ICD-10-CM | POA: Diagnosis not present

## 2015-10-29 DIAGNOSIS — H5201 Hypermetropia, right eye: Secondary | ICD-10-CM | POA: Diagnosis not present

## 2015-10-29 DIAGNOSIS — H353131 Nonexudative age-related macular degeneration, bilateral, early dry stage: Secondary | ICD-10-CM | POA: Diagnosis not present

## 2015-10-29 DIAGNOSIS — H52223 Regular astigmatism, bilateral: Secondary | ICD-10-CM | POA: Diagnosis not present

## 2016-02-04 DIAGNOSIS — G301 Alzheimer's disease with late onset: Secondary | ICD-10-CM | POA: Diagnosis not present

## 2016-02-04 DIAGNOSIS — L89152 Pressure ulcer of sacral region, stage 2: Secondary | ICD-10-CM | POA: Diagnosis not present

## 2016-02-04 DIAGNOSIS — G8929 Other chronic pain: Secondary | ICD-10-CM | POA: Diagnosis not present

## 2016-02-04 DIAGNOSIS — L989 Disorder of the skin and subcutaneous tissue, unspecified: Secondary | ICD-10-CM | POA: Diagnosis not present

## 2016-02-04 DIAGNOSIS — R269 Unspecified abnormalities of gait and mobility: Secondary | ICD-10-CM | POA: Diagnosis not present

## 2016-02-04 DIAGNOSIS — Z79899 Other long term (current) drug therapy: Secondary | ICD-10-CM | POA: Diagnosis not present

## 2016-02-04 DIAGNOSIS — M545 Low back pain: Secondary | ICD-10-CM | POA: Diagnosis not present

## 2016-03-01 DIAGNOSIS — F0281 Dementia in other diseases classified elsewhere with behavioral disturbance: Secondary | ICD-10-CM | POA: Diagnosis not present

## 2016-03-01 DIAGNOSIS — G301 Alzheimer's disease with late onset: Secondary | ICD-10-CM | POA: Diagnosis not present

## 2016-03-01 DIAGNOSIS — Z23 Encounter for immunization: Secondary | ICD-10-CM | POA: Diagnosis not present

## 2016-03-01 DIAGNOSIS — K5901 Slow transit constipation: Secondary | ICD-10-CM | POA: Diagnosis not present

## 2016-03-30 ENCOUNTER — Emergency Department (HOSPITAL_COMMUNITY): Payer: Medicare Other

## 2016-03-30 ENCOUNTER — Emergency Department (HOSPITAL_COMMUNITY)
Admission: EM | Admit: 2016-03-30 | Discharge: 2016-03-31 | Disposition: A | Discharge status: Home / Self Care | Payer: Medicare Other | Attending: Emergency Medicine | Admitting: Emergency Medicine

## 2016-03-30 ENCOUNTER — Encounter (HOSPITAL_COMMUNITY): Payer: Self-pay | Admitting: *Deleted

## 2016-03-30 DIAGNOSIS — Y999 Unspecified external cause status: Secondary | ICD-10-CM | POA: Insufficient documentation

## 2016-03-30 DIAGNOSIS — S3993XA Unspecified injury of pelvis, initial encounter: Secondary | ICD-10-CM | POA: Diagnosis not present

## 2016-03-30 DIAGNOSIS — Y939 Activity, unspecified: Secondary | ICD-10-CM | POA: Diagnosis not present

## 2016-03-30 DIAGNOSIS — Y929 Unspecified place or not applicable: Secondary | ICD-10-CM | POA: Insufficient documentation

## 2016-03-30 DIAGNOSIS — W06XXXA Fall from bed, initial encounter: Secondary | ICD-10-CM | POA: Insufficient documentation

## 2016-03-30 DIAGNOSIS — S199XXA Unspecified injury of neck, initial encounter: Secondary | ICD-10-CM | POA: Diagnosis not present

## 2016-03-30 DIAGNOSIS — S0990XA Unspecified injury of head, initial encounter: Secondary | ICD-10-CM | POA: Diagnosis not present

## 2016-03-30 DIAGNOSIS — Z87891 Personal history of nicotine dependence: Secondary | ICD-10-CM | POA: Diagnosis not present

## 2016-03-30 DIAGNOSIS — S0181XA Laceration without foreign body of other part of head, initial encounter: Secondary | ICD-10-CM | POA: Insufficient documentation

## 2016-03-30 DIAGNOSIS — S299XXA Unspecified injury of thorax, initial encounter: Secondary | ICD-10-CM | POA: Diagnosis not present

## 2016-03-30 DIAGNOSIS — S0010XA Contusion of unspecified eyelid and periocular area, initial encounter: Secondary | ICD-10-CM | POA: Diagnosis not present

## 2016-03-30 DIAGNOSIS — R8271 Bacteriuria: Secondary | ICD-10-CM

## 2016-03-30 LAB — BASIC METABOLIC PANEL
Anion gap: 9 (ref 5–15)
BUN: 9 mg/dL (ref 6–20)
CHLORIDE: 110 mmol/L (ref 101–111)
CO2: 23 mmol/L (ref 22–32)
Calcium: 8.9 mg/dL (ref 8.9–10.3)
Creatinine, Ser: 0.62 mg/dL (ref 0.44–1.00)
GFR calc non Af Amer: >60 mL/min (ref >60)
Glucose, Bld: 121 mg/dL — ABNORMAL HIGH (ref 65–99)
POTASSIUM: 3.2 mmol/L — AB (ref 3.5–5.1)
SODIUM: 142 mmol/L (ref 135–145)

## 2016-03-30 LAB — URINALYSIS, ROUTINE W REFLEX MICROSCOPIC
BILIRUBIN URINE: NEGATIVE
Glucose, UA: NEGATIVE mg/dL
Ketones, ur: 5 mg/dL — AB
LEUKOCYTES UA: NEGATIVE
Nitrite: POSITIVE — AB
Protein, ur: NEGATIVE mg/dL
SPECIFIC GRAVITY, URINE: 1.021 (ref 1.005–1.030)
pH: 5 (ref 5.0–8.0)

## 2016-03-30 LAB — CBC WITH DIFFERENTIAL/PLATELET
BASOS PCT: 0 %
Basophils Absolute: 0 10*3/uL (ref 0.0–0.1)
EOS ABS: 0.1 10*3/uL (ref 0.0–0.7)
Eosinophils Relative: 1 %
HCT: 46.9 % — ABNORMAL HIGH (ref 36.0–46.0)
HEMOGLOBIN: 15.4 g/dL — AB (ref 12.0–15.0)
LYMPHS ABS: 1.9 10*3/uL (ref 0.7–4.0)
Lymphocytes Relative: 21 %
MCH: 30.8 pg (ref 26.0–34.0)
MCHC: 32.8 g/dL (ref 30.0–36.0)
MCV: 93.8 fL (ref 78.0–100.0)
Monocytes Absolute: 1 10*3/uL (ref 0.1–1.0)
Monocytes Relative: 10 %
NEUTROS PCT: 68 %
Neutro Abs: 6.3 10*3/uL (ref 1.7–7.7)
Platelets: 181 10*3/uL (ref 150–400)
RBC: 5 MIL/uL (ref 3.87–5.11)
RDW: 14.6 % (ref 11.5–15.5)
WBC: 9.3 10*3/uL (ref 4.0–10.5)

## 2016-03-30 MED ORDER — CEPHALEXIN 250 MG PO CAPS
500.0000 mg | ORAL_CAPSULE | Freq: Once | ORAL | Status: AC
  Administered 2016-03-30: 500 mg via ORAL
  Filled 2016-03-30: qty 2

## 2016-03-30 MED ORDER — CEPHALEXIN 500 MG PO CAPS: 500.0000 mg | ORAL_CAPSULE | Freq: Three times a day (TID) | ORAL | 0 refills | Status: DC

## 2016-03-30 NOTE — ED Provider Notes (Signed)
[facility] DEPT Provider Note   CSN: [id number] Arrival date & time: 03/30/16  1809     History   Chief Complaint Chief Complaint  Patient presents with  . Fall  . Head Injury    HPI Rhonda Prat is a 83 y.o. female.  The history is provided by a relative and a caregiver.  Fall  This is a new problem. The current episode started 3 to 5 hours ago. The problem occurs constantly. The problem has not changed since onset.Associated symptoms comments: Small cut over right temple and increasing frequency of falls. Nothing aggravates the symptoms. Nothing relieves the symptoms. She has tried nothing for the symptoms.    Past Medical History:  Diagnosis Date  . Alzheimer's dementia   . Anxiety   . Depression     Patient Active Problem List   Diagnosis Date Noted  . Altered awareness, transient 07/21/2014  . Dementia with behavioral disturbance 02/10/2014  . Clinical depression 07/04/2011    Past Surgical History:  Procedure Laterality Date  . APPENDECTOMY    . EYE SURGERY      OB History    No data available       Home Medications    Prior to Admission medications   Medication Sig Start Date End Date Taking? Authorizing Provider  CALCIUM PO Take 1 tablet by mouth at bedtime.    Historical Provider, MD  donepezil (ARICEPT) 10 MG tablet Take 10 mg by mouth at bedtime.  01/12/14   Historical Provider, MD  memantine (NAMENDA XR) 28 MG CP24 24 hr capsule Take 28 mg by mouth at bedtime.    Historical Provider, MD  ondansetron (ZOFRAN ODT) 4 MG disintegrating tablet 4mg  ODT q4 hours prn nausea/vomit 01/24/15   Charlesetta Shanks, MD  oxyCODONE-acetaminophen (PERCOCET/ROXICET) 5-325 MG tablet Take 2 tablets by mouth every 4 (four) hours as needed for severe pain. Patient not taking: Reported on 01/24/2015 12/16/14   Samantha Tripp Dowless, PA-C  sertraline (ZOLOFT) 50 MG tablet Take 25 mg by mouth at bedtime.  03/23/14   Historical Provider, MD  ursodiol (ACTIGALL) 300 MG  capsule Take 300 mg by mouth 3 (three) times daily. 01/11/15   Historical Provider, MD    Family History History reviewed. No pertinent family history.  Social History Social History  Substance Use Topics  . Smoking status: Former Smoker    Quit date: 03/21/1957  . Smokeless tobacco: Never Used  . Alcohol use No     Allergies   Patient has no known allergies.   Review of Systems Review of Systems  All other systems reviewed and are negative.    Physical Exam Updated Vital Signs Ht 5\' 4"  (1.626 m)   Wt 180 lb (81.6 kg)   BMI 30.90 kg/m   Physical Exam  Constitutional: She appears well-developed and well-nourished. No distress.  HENT:  Head: Normocephalic. Head is with laceration (Right temple, 1 cm, hemostatic).    Nose: Nose normal.  Eyes: Conjunctivae are normal.  Neck: Neck supple. No tracheal deviation present.  Cardiovascular: Normal rate, regular rhythm and normal heart sounds.   Pulmonary/Chest: Effort normal and breath sounds normal. No respiratory distress.  Abdominal: Soft. She exhibits no distension. There is no tenderness.  Neurological: She is alert. She has normal strength. She is disoriented (at baseline). No cranial nerve deficit. GCS eye subscore is 4. GCS verbal subscore is 2. GCS motor subscore is 5.  Skin: Skin is warm and dry. Capillary refill takes less than 2 seconds.  Psychiatric: She has a normal mood and affect.  Vitals reviewed.    ED Treatments / Results  Labs (all labs ordered are listed, but only abnormal results are displayed) Labs Reviewed  URINALYSIS, ROUTINE W REFLEX MICROSCOPIC - Abnormal; Notable for the following:       Result Value   Color, Urine AMBER (*)    APPearance HAZY (*)    Hgb urine dipstick MODERATE (*)    Ketones, ur 5 (*)    Nitrite POSITIVE (*)    Bacteria, UA MANY (*)    Squamous Epithelial / LPF 6-30 (*)    All other components within normal limits  CBC WITH DIFFERENTIAL/PLATELET - Abnormal; Notable  for the following:    Hemoglobin 15.4 (*)    HCT 46.9 (*)    All other components within normal limits  BASIC METABOLIC PANEL - Abnormal; Notable for the following:    Potassium 3.2 (*)    Glucose, Bld 121 (*)    All other components within normal limits  URINE CULTURE    EKG  EKG Interpretation None       Radiology Dg Chest 2 View  Result Date: 03/30/2016 CLINICAL DATA:  Nonverbal patient fell out of bed today. EXAM: CHEST  2 VIEW COMPARISON:  CT chest 12/16/2015. FINDINGS: Lungs are clear. Heart size is upper normal. Aortic atherosclerosis noted. No pneumothorax or pleural effusion. No acute bony abnormality. IMPRESSION: And no acute disease. Electronically Signed   By: Inge Rise M.D.   On: 03/30/2016 20:41   Dg Pelvis 1-2 Views  Result Date: 03/30/2016 CLINICAL DATA:  Status post fall out of bed today. EXAM: PELVIS - 1-2 VIEW COMPARISON:  None. FINDINGS: There is no evidence of pelvic fracture or diastasis. No pelvic bone lesions are seen. Degenerative disease about both hips and lower lumbar spine is noted. IMPRESSION: No acute abnormality. Electronically Signed   By: Inge Rise M.D.   On: 03/30/2016 20:40   Ct Head Wo Contrast  Result Date: 03/30/2016 CLINICAL DATA:  Patient fell out of bed and hit head with laceration over right eye. EXAM: CT HEAD WITHOUT CONTRAST CT CERVICAL SPINE WITHOUT CONTRAST TECHNIQUE: Multidetector CT imaging of the head and cervical spine was performed following the standard protocol without intravenous contrast. Multiplanar CT image reconstructions of the cervical spine were also generated. COMPARISON:  03/12/2014 head CT. FINDINGS: CT HEAD FINDINGS Brain: There is no evidence for acute hemorrhage, hydrocephalus, mass lesion, or abnormal extra-axial fluid collection. No definite CT evidence for acute infarction. Diffuse loss of parenchymal volume is consistent with atrophy. Patchy low attenuation in the deep hemispheric and periventricular  white matter is nonspecific, but likely reflects chronic microvascular ischemic demyelination. Vascular: Atherosclerotic calcification is visualized in the carotid arteries. No dense MCA sign. Major dural sinuses are unremarkable. Skull: No evidence for fracture. No worrisome lytic or sclerotic lesion. Sinuses/Orbits: Visualized portions of the globes and intraorbital fat are unremarkable. The visualized paranasal sinuses and mastoid air cells are clear. Other: None. CT CERVICAL SPINE FINDINGS Alignment: Straightening of the normal cervical lordosis is evident. Cervical facets are well aligned bilaterally. Skull base and vertebrae: No evidence for acute fracture from the skullbase to the T1-2 interspace. Soft tissues and spinal canal: No prevertebral fluid or swelling. No visible canal hematoma. Disc levels: Loss of disc height with endplate spurring is seen at C5-6. Left-sided facet osteoarthritis in is evident at C4-5, C5-6, and C6-7. Upper chest: Posterior atelectasis right apex is unchanged since CT chest of 12/16/2014  Other: None. IMPRESSION: 1. No acute intracranial abnormality. 2. Atrophy with chronic small vessel white matter ischemic disease. 3. Degenerative changes in the cervical spine without fracture. 4. Loss of cervical lordosis. This can be related to patient positioning, muscle spasm or soft tissue injury. Electronically Signed   By: Misty Stanley M.D.   On: 03/30/2016 20:04   Ct Cervical Spine Wo Contrast  Result Date: 03/30/2016 CLINICAL DATA:  Patient fell out of bed and hit head with laceration over right eye. EXAM: CT HEAD WITHOUT CONTRAST CT CERVICAL SPINE WITHOUT CONTRAST TECHNIQUE: Multidetector CT imaging of the head and cervical spine was performed following the standard protocol without intravenous contrast. Multiplanar CT image reconstructions of the cervical spine were also generated. COMPARISON:  03/12/2014 head CT. FINDINGS: CT HEAD FINDINGS Brain: There is no evidence for acute  hemorrhage, hydrocephalus, mass lesion, or abnormal extra-axial fluid collection. No definite CT evidence for acute infarction. Diffuse loss of parenchymal volume is consistent with atrophy. Patchy low attenuation in the deep hemispheric and periventricular white matter is nonspecific, but likely reflects chronic microvascular ischemic demyelination. Vascular: Atherosclerotic calcification is visualized in the carotid arteries. No dense MCA sign. Major dural sinuses are unremarkable. Skull: No evidence for fracture. No worrisome lytic or sclerotic lesion. Sinuses/Orbits: Visualized portions of the globes and intraorbital fat are unremarkable. The visualized paranasal sinuses and mastoid air cells are clear. Other: None. CT CERVICAL SPINE FINDINGS Alignment: Straightening of the normal cervical lordosis is evident. Cervical facets are well aligned bilaterally. Skull base and vertebrae: No evidence for acute fracture from the skullbase to the T1-2 interspace. Soft tissues and spinal canal: No prevertebral fluid or swelling. No visible canal hematoma. Disc levels: Loss of disc height with endplate spurring is seen at C5-6. Left-sided facet osteoarthritis in is evident at C4-5, C5-6, and C6-7. Upper chest: Posterior atelectasis right apex is unchanged since CT chest of 12/16/2014 Other: None. IMPRESSION: 1. No acute intracranial abnormality. 2. Atrophy with chronic small vessel white matter ischemic disease. 3. Degenerative changes in the cervical spine without fracture. 4. Loss of cervical lordosis. This can be related to patient positioning, muscle spasm or soft tissue injury. Electronically Signed   By: Misty Stanley M.D.   On: 03/30/2016 20:04    Procedures Procedures (including critical care time)  LACERATION REPAIR Performed by: Leo Grosser Authorized by: Leo Grosser Consent: Verbal consent obtained. Risks and benefits: risks, benefits and alternatives were discussed Consent given by:  patient Patient identity confirmed: provided demographic data Prepped and Draped in normal sterile fashion Wound explored  Laceration Location: right temple  Laceration Length: 1 cm  No Foreign Bodies seen or palpated  Anesthesia: local infiltration  Local anesthetic: none  Irrigation method: syringe Amount of cleaning: standard  Skin closure: glue  Number of sutures: n/a  Technique: simple  Patient tolerance: Patient tolerated the procedure well with no immediate complications.   Medications Ordered in ED Medications  cephALEXin (KEFLEX) capsule 500 mg (500 mg Oral Given 03/30/16 2300)     Initial Impression / Assessment and Plan / ED Course  I have reviewed the triage vital signs and the nursing notes.  Pertinent labs & imaging results that were available during my care of the patient were reviewed by me and considered in my medical decision making (see chart for details).  Clinical Course     83 year old female presents after fall from bed where she sustained a small laceration to her right head. She has multiple falls here recently and  family is concerned for possible urinary tract infection as she has had similar presentations in the past with this. Imaging is negative for head, neck, chest or pelvis injuries and no extremity injuries noted/no abdominal pain on exam.  Questionable findings for UTI with positive nitrites and bacteriuria.Will treat empirically with keflex pending culture. Follow-up with primary care physician is recommended and return precautions discussed for worsening or new concerning symptoms.  Final Clinical Impressions(s) / ED Diagnoses   Final diagnoses:  Facial laceration, initial encounter  Bacteriuria    New Prescriptions New Prescriptions   CEPHALEXIN (KEFLEX) 500 MG CAPSULE    Take 1 capsule (500 mg total) by mouth 3 (three) times daily.     Leo Grosser, MD 03/30/16 [phone]

## 2016-03-30 NOTE — ED Notes (Signed)
Patient transported to X-ray 

## 2016-03-30 NOTE — ED Notes (Signed)
Dermabond at bedside.  

## 2016-03-30 NOTE — ED Triage Notes (Signed)
Patient comes in per [facility] post fall. From independent living with caregiver. At baseline which is alert/ox1 to self. Patient fell out of bed today. Hit head, lac over right eye. bleeding controlled. Dried blood coming from mouth, poss bit tongue. c-collar on. Patient is not able to communicate what hurts. Patient is not on any blood thinners. EMS 140/82, 80 HR, 162 fsbs.

## 2016-03-31 DIAGNOSIS — R58 Hemorrhage, not elsewhere classified: Secondary | ICD-10-CM | POA: Diagnosis not present

## 2016-03-31 DIAGNOSIS — R259 Unspecified abnormal involuntary movements: Secondary | ICD-10-CM | POA: Diagnosis not present

## 2016-03-31 NOTE — ED Notes (Signed)
Patient transported to ALF by [facility].

## 2016-04-02 LAB — URINE CULTURE: Culture: 80000 — AB

## 2016-04-03 ENCOUNTER — Telehealth: Payer: Self-pay

## 2016-04-03 NOTE — Telephone Encounter (Signed)
Post ED Visit - Positive Culture Follow-up  Culture report reviewed by antimicrobial stewardship pharmacist:  []  Elenor Quinones, Pharm.D. []  Heide Guile, Pharm.D., BCPS []  Parks Neptune, Pharm.D. []  Alycia Rossetti, Pharm.D., BCPS []  Dante, Pharm.D., BCPS, AAHIVP []  Legrand Como, Pharm.D., BCPS, AAHIVP []  Milus Glazier, Pharm.D. []  Stephens November, [place].D. Dimitri Ped Pharm D Positive Urine culture Treated with Cephaelxin, organism sensitive to the same and no further patient follow-up is required at this time.  Genia Del 04/03/2016, 9:36 AM

## 2016-04-16 DIAGNOSIS — F0280 Dementia in other diseases classified elsewhere without behavioral disturbance: Secondary | ICD-10-CM | POA: Diagnosis not present

## 2016-04-16 DIAGNOSIS — Z9181 History of falling: Secondary | ICD-10-CM | POA: Diagnosis not present

## 2016-04-16 DIAGNOSIS — F419 Anxiety disorder, unspecified: Secondary | ICD-10-CM | POA: Diagnosis not present

## 2016-04-16 DIAGNOSIS — R296 Repeated falls: Secondary | ICD-10-CM | POA: Diagnosis not present

## 2016-04-16 DIAGNOSIS — L989 Disorder of the skin and subcutaneous tissue, unspecified: Secondary | ICD-10-CM | POA: Diagnosis not present

## 2016-04-16 DIAGNOSIS — G309 Alzheimer's disease, unspecified: Secondary | ICD-10-CM | POA: Diagnosis not present

## 2016-04-19 DIAGNOSIS — G309 Alzheimer's disease, unspecified: Secondary | ICD-10-CM | POA: Diagnosis not present

## 2016-04-19 DIAGNOSIS — F0280 Dementia in other diseases classified elsewhere without behavioral disturbance: Secondary | ICD-10-CM | POA: Diagnosis not present

## 2016-04-19 DIAGNOSIS — L989 Disorder of the skin and subcutaneous tissue, unspecified: Secondary | ICD-10-CM | POA: Diagnosis not present

## 2016-04-19 DIAGNOSIS — F419 Anxiety disorder, unspecified: Secondary | ICD-10-CM | POA: Diagnosis not present

## 2016-04-19 DIAGNOSIS — R296 Repeated falls: Secondary | ICD-10-CM | POA: Diagnosis not present

## 2016-04-19 DIAGNOSIS — Z9181 History of falling: Secondary | ICD-10-CM | POA: Diagnosis not present

## 2016-04-20 DIAGNOSIS — Z993 Dependence on wheelchair: Secondary | ICD-10-CM | POA: Diagnosis not present

## 2016-04-20 DIAGNOSIS — L989 Disorder of the skin and subcutaneous tissue, unspecified: Secondary | ICD-10-CM | POA: Diagnosis not present

## 2016-04-20 DIAGNOSIS — F0280 Dementia in other diseases classified elsewhere without behavioral disturbance: Secondary | ICD-10-CM | POA: Diagnosis not present

## 2016-04-20 DIAGNOSIS — G309 Alzheimer's disease, unspecified: Secondary | ICD-10-CM | POA: Diagnosis not present

## 2016-04-20 DIAGNOSIS — R296 Repeated falls: Secondary | ICD-10-CM | POA: Diagnosis not present

## 2016-04-20 DIAGNOSIS — F419 Anxiety disorder, unspecified: Secondary | ICD-10-CM | POA: Diagnosis not present

## 2016-04-20 DIAGNOSIS — L89309 Pressure ulcer of unspecified buttock, unspecified stage: Secondary | ICD-10-CM | POA: Diagnosis not present

## 2016-04-20 DIAGNOSIS — Z9181 History of falling: Secondary | ICD-10-CM | POA: Diagnosis not present

## 2016-04-21 DIAGNOSIS — R296 Repeated falls: Secondary | ICD-10-CM | POA: Diagnosis not present

## 2016-04-21 DIAGNOSIS — F0280 Dementia in other diseases classified elsewhere without behavioral disturbance: Secondary | ICD-10-CM | POA: Diagnosis not present

## 2016-04-21 DIAGNOSIS — L989 Disorder of the skin and subcutaneous tissue, unspecified: Secondary | ICD-10-CM | POA: Diagnosis not present

## 2016-04-21 DIAGNOSIS — Z9181 History of falling: Secondary | ICD-10-CM | POA: Diagnosis not present

## 2016-04-21 DIAGNOSIS — G309 Alzheimer's disease, unspecified: Secondary | ICD-10-CM | POA: Diagnosis not present

## 2016-04-21 DIAGNOSIS — F419 Anxiety disorder, unspecified: Secondary | ICD-10-CM | POA: Diagnosis not present

## 2016-04-22 DIAGNOSIS — R296 Repeated falls: Secondary | ICD-10-CM | POA: Diagnosis not present

## 2016-04-22 DIAGNOSIS — L989 Disorder of the skin and subcutaneous tissue, unspecified: Secondary | ICD-10-CM | POA: Diagnosis not present

## 2016-04-22 DIAGNOSIS — F0280 Dementia in other diseases classified elsewhere without behavioral disturbance: Secondary | ICD-10-CM | POA: Diagnosis not present

## 2016-04-22 DIAGNOSIS — G309 Alzheimer's disease, unspecified: Secondary | ICD-10-CM | POA: Diagnosis not present

## 2016-04-22 DIAGNOSIS — Z9181 History of falling: Secondary | ICD-10-CM | POA: Diagnosis not present

## 2016-04-22 DIAGNOSIS — F419 Anxiety disorder, unspecified: Secondary | ICD-10-CM | POA: Diagnosis not present

## 2016-04-25 DIAGNOSIS — F419 Anxiety disorder, unspecified: Secondary | ICD-10-CM | POA: Diagnosis not present

## 2016-04-25 DIAGNOSIS — G309 Alzheimer's disease, unspecified: Secondary | ICD-10-CM | POA: Diagnosis not present

## 2016-04-25 DIAGNOSIS — R296 Repeated falls: Secondary | ICD-10-CM | POA: Diagnosis not present

## 2016-04-25 DIAGNOSIS — Z9181 History of falling: Secondary | ICD-10-CM | POA: Diagnosis not present

## 2016-04-25 DIAGNOSIS — L989 Disorder of the skin and subcutaneous tissue, unspecified: Secondary | ICD-10-CM | POA: Diagnosis not present

## 2016-04-25 DIAGNOSIS — F0280 Dementia in other diseases classified elsewhere without behavioral disturbance: Secondary | ICD-10-CM | POA: Diagnosis not present

## 2016-04-26 DIAGNOSIS — F419 Anxiety disorder, unspecified: Secondary | ICD-10-CM | POA: Diagnosis not present

## 2016-04-26 DIAGNOSIS — F0280 Dementia in other diseases classified elsewhere without behavioral disturbance: Secondary | ICD-10-CM | POA: Diagnosis not present

## 2016-04-26 DIAGNOSIS — L989 Disorder of the skin and subcutaneous tissue, unspecified: Secondary | ICD-10-CM | POA: Diagnosis not present

## 2016-04-26 DIAGNOSIS — G309 Alzheimer's disease, unspecified: Secondary | ICD-10-CM | POA: Diagnosis not present

## 2016-04-26 DIAGNOSIS — R296 Repeated falls: Secondary | ICD-10-CM | POA: Diagnosis not present

## 2016-04-26 DIAGNOSIS — Z9181 History of falling: Secondary | ICD-10-CM | POA: Diagnosis not present

## 2016-04-27 DIAGNOSIS — F0280 Dementia in other diseases classified elsewhere without behavioral disturbance: Secondary | ICD-10-CM | POA: Diagnosis not present

## 2016-04-27 DIAGNOSIS — R296 Repeated falls: Secondary | ICD-10-CM | POA: Diagnosis not present

## 2016-04-27 DIAGNOSIS — L989 Disorder of the skin and subcutaneous tissue, unspecified: Secondary | ICD-10-CM | POA: Diagnosis not present

## 2016-04-27 DIAGNOSIS — Z9181 History of falling: Secondary | ICD-10-CM | POA: Diagnosis not present

## 2016-04-27 DIAGNOSIS — G309 Alzheimer's disease, unspecified: Secondary | ICD-10-CM | POA: Diagnosis not present

## 2016-04-27 DIAGNOSIS — F419 Anxiety disorder, unspecified: Secondary | ICD-10-CM | POA: Diagnosis not present

## 2016-04-28 DIAGNOSIS — G309 Alzheimer's disease, unspecified: Secondary | ICD-10-CM | POA: Diagnosis not present

## 2016-04-28 DIAGNOSIS — Z9181 History of falling: Secondary | ICD-10-CM | POA: Diagnosis not present

## 2016-04-28 DIAGNOSIS — L989 Disorder of the skin and subcutaneous tissue, unspecified: Secondary | ICD-10-CM | POA: Diagnosis not present

## 2016-04-28 DIAGNOSIS — F419 Anxiety disorder, unspecified: Secondary | ICD-10-CM | POA: Diagnosis not present

## 2016-04-28 DIAGNOSIS — R296 Repeated falls: Secondary | ICD-10-CM | POA: Diagnosis not present

## 2016-04-28 DIAGNOSIS — F0280 Dementia in other diseases classified elsewhere without behavioral disturbance: Secondary | ICD-10-CM | POA: Diagnosis not present

## 2016-05-01 DIAGNOSIS — L989 Disorder of the skin and subcutaneous tissue, unspecified: Secondary | ICD-10-CM | POA: Diagnosis not present

## 2016-05-01 DIAGNOSIS — Z9181 History of falling: Secondary | ICD-10-CM | POA: Diagnosis not present

## 2016-05-01 DIAGNOSIS — F0280 Dementia in other diseases classified elsewhere without behavioral disturbance: Secondary | ICD-10-CM | POA: Diagnosis not present

## 2016-05-01 DIAGNOSIS — R296 Repeated falls: Secondary | ICD-10-CM | POA: Diagnosis not present

## 2016-05-01 DIAGNOSIS — F419 Anxiety disorder, unspecified: Secondary | ICD-10-CM | POA: Diagnosis not present

## 2016-05-01 DIAGNOSIS — G309 Alzheimer's disease, unspecified: Secondary | ICD-10-CM | POA: Diagnosis not present

## 2016-05-03 DIAGNOSIS — L989 Disorder of the skin and subcutaneous tissue, unspecified: Secondary | ICD-10-CM | POA: Diagnosis not present

## 2016-05-03 DIAGNOSIS — F419 Anxiety disorder, unspecified: Secondary | ICD-10-CM | POA: Diagnosis not present

## 2016-05-03 DIAGNOSIS — F0280 Dementia in other diseases classified elsewhere without behavioral disturbance: Secondary | ICD-10-CM | POA: Diagnosis not present

## 2016-05-03 DIAGNOSIS — G309 Alzheimer's disease, unspecified: Secondary | ICD-10-CM | POA: Diagnosis not present

## 2016-05-03 DIAGNOSIS — Z9181 History of falling: Secondary | ICD-10-CM | POA: Diagnosis not present

## 2016-05-03 DIAGNOSIS — R296 Repeated falls: Secondary | ICD-10-CM | POA: Diagnosis not present

## 2016-05-04 DIAGNOSIS — R296 Repeated falls: Secondary | ICD-10-CM | POA: Diagnosis not present

## 2016-05-04 DIAGNOSIS — F419 Anxiety disorder, unspecified: Secondary | ICD-10-CM | POA: Diagnosis not present

## 2016-05-04 DIAGNOSIS — G309 Alzheimer's disease, unspecified: Secondary | ICD-10-CM | POA: Diagnosis not present

## 2016-05-04 DIAGNOSIS — Z9181 History of falling: Secondary | ICD-10-CM | POA: Diagnosis not present

## 2016-05-04 DIAGNOSIS — L989 Disorder of the skin and subcutaneous tissue, unspecified: Secondary | ICD-10-CM | POA: Diagnosis not present

## 2016-05-04 DIAGNOSIS — F0280 Dementia in other diseases classified elsewhere without behavioral disturbance: Secondary | ICD-10-CM | POA: Diagnosis not present

## 2016-05-05 DIAGNOSIS — R296 Repeated falls: Secondary | ICD-10-CM | POA: Diagnosis not present

## 2016-05-05 DIAGNOSIS — G309 Alzheimer's disease, unspecified: Secondary | ICD-10-CM | POA: Diagnosis not present

## 2016-05-05 DIAGNOSIS — F0280 Dementia in other diseases classified elsewhere without behavioral disturbance: Secondary | ICD-10-CM | POA: Diagnosis not present

## 2016-05-05 DIAGNOSIS — F419 Anxiety disorder, unspecified: Secondary | ICD-10-CM | POA: Diagnosis not present

## 2016-05-05 DIAGNOSIS — L989 Disorder of the skin and subcutaneous tissue, unspecified: Secondary | ICD-10-CM | POA: Diagnosis not present

## 2016-05-05 DIAGNOSIS — Z9181 History of falling: Secondary | ICD-10-CM | POA: Diagnosis not present

## 2016-05-06 DIAGNOSIS — F0280 Dementia in other diseases classified elsewhere without behavioral disturbance: Secondary | ICD-10-CM | POA: Diagnosis not present

## 2016-05-06 DIAGNOSIS — F419 Anxiety disorder, unspecified: Secondary | ICD-10-CM | POA: Diagnosis not present

## 2016-05-06 DIAGNOSIS — Z9181 History of falling: Secondary | ICD-10-CM | POA: Diagnosis not present

## 2016-05-06 DIAGNOSIS — R296 Repeated falls: Secondary | ICD-10-CM | POA: Diagnosis not present

## 2016-05-06 DIAGNOSIS — L989 Disorder of the skin and subcutaneous tissue, unspecified: Secondary | ICD-10-CM | POA: Diagnosis not present

## 2016-05-06 DIAGNOSIS — G309 Alzheimer's disease, unspecified: Secondary | ICD-10-CM | POA: Diagnosis not present

## 2016-05-10 DIAGNOSIS — L989 Disorder of the skin and subcutaneous tissue, unspecified: Secondary | ICD-10-CM | POA: Diagnosis not present

## 2016-05-10 DIAGNOSIS — G309 Alzheimer's disease, unspecified: Secondary | ICD-10-CM | POA: Diagnosis not present

## 2016-05-10 DIAGNOSIS — F0280 Dementia in other diseases classified elsewhere without behavioral disturbance: Secondary | ICD-10-CM | POA: Diagnosis not present

## 2016-05-10 DIAGNOSIS — Z9181 History of falling: Secondary | ICD-10-CM | POA: Diagnosis not present

## 2016-05-10 DIAGNOSIS — R296 Repeated falls: Secondary | ICD-10-CM | POA: Diagnosis not present

## 2016-05-10 DIAGNOSIS — F419 Anxiety disorder, unspecified: Secondary | ICD-10-CM | POA: Diagnosis not present

## 2016-05-11 DIAGNOSIS — F064 Anxiety disorder due to known physiological condition: Secondary | ICD-10-CM | POA: Diagnosis not present

## 2016-05-11 DIAGNOSIS — G309 Alzheimer's disease, unspecified: Secondary | ICD-10-CM | POA: Diagnosis not present

## 2016-05-12 DIAGNOSIS — F419 Anxiety disorder, unspecified: Secondary | ICD-10-CM | POA: Diagnosis not present

## 2016-05-12 DIAGNOSIS — F0280 Dementia in other diseases classified elsewhere without behavioral disturbance: Secondary | ICD-10-CM | POA: Diagnosis not present

## 2016-05-12 DIAGNOSIS — Z9181 History of falling: Secondary | ICD-10-CM | POA: Diagnosis not present

## 2016-05-12 DIAGNOSIS — G309 Alzheimer's disease, unspecified: Secondary | ICD-10-CM | POA: Diagnosis not present

## 2016-05-12 DIAGNOSIS — R296 Repeated falls: Secondary | ICD-10-CM | POA: Diagnosis not present

## 2016-05-12 DIAGNOSIS — L989 Disorder of the skin and subcutaneous tissue, unspecified: Secondary | ICD-10-CM | POA: Diagnosis not present

## 2016-05-16 DIAGNOSIS — Z993 Dependence on wheelchair: Secondary | ICD-10-CM | POA: Diagnosis not present

## 2016-05-19 DIAGNOSIS — G309 Alzheimer's disease, unspecified: Secondary | ICD-10-CM | POA: Diagnosis not present

## 2016-05-19 DIAGNOSIS — F419 Anxiety disorder, unspecified: Secondary | ICD-10-CM | POA: Diagnosis not present

## 2016-05-19 DIAGNOSIS — R296 Repeated falls: Secondary | ICD-10-CM | POA: Diagnosis not present

## 2016-05-19 DIAGNOSIS — L989 Disorder of the skin and subcutaneous tissue, unspecified: Secondary | ICD-10-CM | POA: Diagnosis not present

## 2016-05-19 DIAGNOSIS — Z9181 History of falling: Secondary | ICD-10-CM | POA: Diagnosis not present

## 2016-05-19 DIAGNOSIS — F0280 Dementia in other diseases classified elsewhere without behavioral disturbance: Secondary | ICD-10-CM | POA: Diagnosis not present

## 2016-05-24 DIAGNOSIS — G309 Alzheimer's disease, unspecified: Secondary | ICD-10-CM | POA: Diagnosis not present

## 2016-05-24 DIAGNOSIS — R296 Repeated falls: Secondary | ICD-10-CM | POA: Diagnosis not present

## 2016-05-24 DIAGNOSIS — F0280 Dementia in other diseases classified elsewhere without behavioral disturbance: Secondary | ICD-10-CM | POA: Diagnosis not present

## 2016-05-24 DIAGNOSIS — L989 Disorder of the skin and subcutaneous tissue, unspecified: Secondary | ICD-10-CM | POA: Diagnosis not present

## 2016-05-24 DIAGNOSIS — Z9181 History of falling: Secondary | ICD-10-CM | POA: Diagnosis not present

## 2016-05-24 DIAGNOSIS — F419 Anxiety disorder, unspecified: Secondary | ICD-10-CM | POA: Diagnosis not present

## 2016-05-25 DIAGNOSIS — G309 Alzheimer's disease, unspecified: Secondary | ICD-10-CM | POA: Diagnosis not present

## 2016-05-25 DIAGNOSIS — M6281 Muscle weakness (generalized): Secondary | ICD-10-CM | POA: Diagnosis not present

## 2016-05-25 DIAGNOSIS — Z993 Dependence on wheelchair: Secondary | ICD-10-CM | POA: Diagnosis not present

## 2016-05-25 DIAGNOSIS — L89309 Pressure ulcer of unspecified buttock, unspecified stage: Secondary | ICD-10-CM | POA: Diagnosis not present

## 2016-05-25 DIAGNOSIS — F064 Anxiety disorder due to known physiological condition: Secondary | ICD-10-CM | POA: Diagnosis not present

## 2016-05-26 DIAGNOSIS — F0280 Dementia in other diseases classified elsewhere without behavioral disturbance: Secondary | ICD-10-CM | POA: Diagnosis not present

## 2016-05-26 DIAGNOSIS — L989 Disorder of the skin and subcutaneous tissue, unspecified: Secondary | ICD-10-CM | POA: Diagnosis not present

## 2016-05-26 DIAGNOSIS — F419 Anxiety disorder, unspecified: Secondary | ICD-10-CM | POA: Diagnosis not present

## 2016-05-26 DIAGNOSIS — G309 Alzheimer's disease, unspecified: Secondary | ICD-10-CM | POA: Diagnosis not present

## 2016-05-26 DIAGNOSIS — R296 Repeated falls: Secondary | ICD-10-CM | POA: Diagnosis not present

## 2016-05-26 DIAGNOSIS — Z9181 History of falling: Secondary | ICD-10-CM | POA: Diagnosis not present

## 2016-06-01 DIAGNOSIS — Z9181 History of falling: Secondary | ICD-10-CM | POA: Diagnosis not present

## 2016-06-01 DIAGNOSIS — R296 Repeated falls: Secondary | ICD-10-CM | POA: Diagnosis not present

## 2016-06-01 DIAGNOSIS — L989 Disorder of the skin and subcutaneous tissue, unspecified: Secondary | ICD-10-CM | POA: Diagnosis not present

## 2016-06-01 DIAGNOSIS — F0280 Dementia in other diseases classified elsewhere without behavioral disturbance: Secondary | ICD-10-CM | POA: Diagnosis not present

## 2016-06-01 DIAGNOSIS — G309 Alzheimer's disease, unspecified: Secondary | ICD-10-CM | POA: Diagnosis not present

## 2016-06-01 DIAGNOSIS — F419 Anxiety disorder, unspecified: Secondary | ICD-10-CM | POA: Diagnosis not present

## 2016-06-03 DIAGNOSIS — F0280 Dementia in other diseases classified elsewhere without behavioral disturbance: Secondary | ICD-10-CM | POA: Diagnosis not present

## 2016-06-03 DIAGNOSIS — Z9181 History of falling: Secondary | ICD-10-CM | POA: Diagnosis not present

## 2016-06-03 DIAGNOSIS — L989 Disorder of the skin and subcutaneous tissue, unspecified: Secondary | ICD-10-CM | POA: Diagnosis not present

## 2016-06-03 DIAGNOSIS — F419 Anxiety disorder, unspecified: Secondary | ICD-10-CM | POA: Diagnosis not present

## 2016-06-03 DIAGNOSIS — G309 Alzheimer's disease, unspecified: Secondary | ICD-10-CM | POA: Diagnosis not present

## 2016-06-03 DIAGNOSIS — R296 Repeated falls: Secondary | ICD-10-CM | POA: Diagnosis not present

## 2016-06-06 DIAGNOSIS — F419 Anxiety disorder, unspecified: Secondary | ICD-10-CM | POA: Diagnosis not present

## 2016-06-06 DIAGNOSIS — L989 Disorder of the skin and subcutaneous tissue, unspecified: Secondary | ICD-10-CM | POA: Diagnosis not present

## 2016-06-06 DIAGNOSIS — R296 Repeated falls: Secondary | ICD-10-CM | POA: Diagnosis not present

## 2016-06-06 DIAGNOSIS — Z9181 History of falling: Secondary | ICD-10-CM | POA: Diagnosis not present

## 2016-06-06 DIAGNOSIS — F0280 Dementia in other diseases classified elsewhere without behavioral disturbance: Secondary | ICD-10-CM | POA: Diagnosis not present

## 2016-06-06 DIAGNOSIS — G309 Alzheimer's disease, unspecified: Secondary | ICD-10-CM | POA: Diagnosis not present

## 2016-06-09 DIAGNOSIS — G309 Alzheimer's disease, unspecified: Secondary | ICD-10-CM | POA: Diagnosis not present

## 2016-06-09 DIAGNOSIS — Z9181 History of falling: Secondary | ICD-10-CM | POA: Diagnosis not present

## 2016-06-09 DIAGNOSIS — F0280 Dementia in other diseases classified elsewhere without behavioral disturbance: Secondary | ICD-10-CM | POA: Diagnosis not present

## 2016-06-09 DIAGNOSIS — L989 Disorder of the skin and subcutaneous tissue, unspecified: Secondary | ICD-10-CM | POA: Diagnosis not present

## 2016-06-09 DIAGNOSIS — R296 Repeated falls: Secondary | ICD-10-CM | POA: Diagnosis not present

## 2016-06-09 DIAGNOSIS — F419 Anxiety disorder, unspecified: Secondary | ICD-10-CM | POA: Diagnosis not present

## 2016-06-13 DIAGNOSIS — L989 Disorder of the skin and subcutaneous tissue, unspecified: Secondary | ICD-10-CM | POA: Diagnosis not present

## 2016-06-13 DIAGNOSIS — F419 Anxiety disorder, unspecified: Secondary | ICD-10-CM | POA: Diagnosis not present

## 2016-06-13 DIAGNOSIS — Z9181 History of falling: Secondary | ICD-10-CM | POA: Diagnosis not present

## 2016-06-13 DIAGNOSIS — F0280 Dementia in other diseases classified elsewhere without behavioral disturbance: Secondary | ICD-10-CM | POA: Diagnosis not present

## 2016-06-13 DIAGNOSIS — R296 Repeated falls: Secondary | ICD-10-CM | POA: Diagnosis not present

## 2016-06-13 DIAGNOSIS — G309 Alzheimer's disease, unspecified: Secondary | ICD-10-CM | POA: Diagnosis not present

## 2016-06-14 DIAGNOSIS — Z993 Dependence on wheelchair: Secondary | ICD-10-CM | POA: Diagnosis not present

## 2016-06-22 DIAGNOSIS — R1312 Dysphagia, oropharyngeal phase: Secondary | ICD-10-CM | POA: Diagnosis not present

## 2016-06-22 DIAGNOSIS — R488 Other symbolic dysfunctions: Secondary | ICD-10-CM | POA: Diagnosis not present

## 2016-06-22 DIAGNOSIS — F064 Anxiety disorder due to known physiological condition: Secondary | ICD-10-CM | POA: Diagnosis not present

## 2016-06-22 DIAGNOSIS — Z993 Dependence on wheelchair: Secondary | ICD-10-CM | POA: Diagnosis not present

## 2016-06-22 DIAGNOSIS — R41841 Cognitive communication deficit: Secondary | ICD-10-CM | POA: Diagnosis not present

## 2016-06-22 DIAGNOSIS — G309 Alzheimer's disease, unspecified: Secondary | ICD-10-CM | POA: Diagnosis not present

## 2016-06-22 DIAGNOSIS — M6281 Muscle weakness (generalized): Secondary | ICD-10-CM | POA: Diagnosis not present

## 2016-06-22 DIAGNOSIS — L89309 Pressure ulcer of unspecified buttock, unspecified stage: Secondary | ICD-10-CM | POA: Diagnosis not present

## 2016-06-22 DIAGNOSIS — R1311 Dysphagia, oral phase: Secondary | ICD-10-CM | POA: Diagnosis not present

## 2016-06-22 DIAGNOSIS — Z9181 History of falling: Secondary | ICD-10-CM | POA: Diagnosis not present

## 2016-06-23 DIAGNOSIS — R488 Other symbolic dysfunctions: Secondary | ICD-10-CM | POA: Diagnosis not present

## 2016-06-23 DIAGNOSIS — R1311 Dysphagia, oral phase: Secondary | ICD-10-CM | POA: Diagnosis not present

## 2016-06-23 DIAGNOSIS — R41841 Cognitive communication deficit: Secondary | ICD-10-CM | POA: Diagnosis not present

## 2016-06-23 DIAGNOSIS — Z9181 History of falling: Secondary | ICD-10-CM | POA: Diagnosis not present

## 2016-06-23 DIAGNOSIS — M6281 Muscle weakness (generalized): Secondary | ICD-10-CM | POA: Diagnosis not present

## 2016-06-23 DIAGNOSIS — R1312 Dysphagia, oropharyngeal phase: Secondary | ICD-10-CM | POA: Diagnosis not present

## 2016-06-24 DIAGNOSIS — R488 Other symbolic dysfunctions: Secondary | ICD-10-CM | POA: Diagnosis not present

## 2016-06-24 DIAGNOSIS — R1312 Dysphagia, oropharyngeal phase: Secondary | ICD-10-CM | POA: Diagnosis not present

## 2016-06-24 DIAGNOSIS — R1311 Dysphagia, oral phase: Secondary | ICD-10-CM | POA: Diagnosis not present

## 2016-06-24 DIAGNOSIS — R41841 Cognitive communication deficit: Secondary | ICD-10-CM | POA: Diagnosis not present

## 2016-06-24 DIAGNOSIS — Z9181 History of falling: Secondary | ICD-10-CM | POA: Diagnosis not present

## 2016-06-24 DIAGNOSIS — M6281 Muscle weakness (generalized): Secondary | ICD-10-CM | POA: Diagnosis not present

## 2016-06-27 DIAGNOSIS — Z9181 History of falling: Secondary | ICD-10-CM | POA: Diagnosis not present

## 2016-06-27 DIAGNOSIS — R1311 Dysphagia, oral phase: Secondary | ICD-10-CM | POA: Diagnosis not present

## 2016-06-27 DIAGNOSIS — M6281 Muscle weakness (generalized): Secondary | ICD-10-CM | POA: Diagnosis not present

## 2016-06-27 DIAGNOSIS — R1312 Dysphagia, oropharyngeal phase: Secondary | ICD-10-CM | POA: Diagnosis not present

## 2016-06-27 DIAGNOSIS — R41841 Cognitive communication deficit: Secondary | ICD-10-CM | POA: Diagnosis not present

## 2016-06-27 DIAGNOSIS — R488 Other symbolic dysfunctions: Secondary | ICD-10-CM | POA: Diagnosis not present

## 2016-06-28 DIAGNOSIS — R41841 Cognitive communication deficit: Secondary | ICD-10-CM | POA: Diagnosis not present

## 2016-06-28 DIAGNOSIS — Z9181 History of falling: Secondary | ICD-10-CM | POA: Diagnosis not present

## 2016-06-28 DIAGNOSIS — M6281 Muscle weakness (generalized): Secondary | ICD-10-CM | POA: Diagnosis not present

## 2016-06-28 DIAGNOSIS — R488 Other symbolic dysfunctions: Secondary | ICD-10-CM | POA: Diagnosis not present

## 2016-06-28 DIAGNOSIS — R1311 Dysphagia, oral phase: Secondary | ICD-10-CM | POA: Diagnosis not present

## 2016-06-28 DIAGNOSIS — R1312 Dysphagia, oropharyngeal phase: Secondary | ICD-10-CM | POA: Diagnosis not present

## 2016-06-29 DIAGNOSIS — R1311 Dysphagia, oral phase: Secondary | ICD-10-CM | POA: Diagnosis not present

## 2016-06-29 DIAGNOSIS — R488 Other symbolic dysfunctions: Secondary | ICD-10-CM | POA: Diagnosis not present

## 2016-06-29 DIAGNOSIS — R41841 Cognitive communication deficit: Secondary | ICD-10-CM | POA: Diagnosis not present

## 2016-06-29 DIAGNOSIS — R1312 Dysphagia, oropharyngeal phase: Secondary | ICD-10-CM | POA: Diagnosis not present

## 2016-06-29 DIAGNOSIS — M6281 Muscle weakness (generalized): Secondary | ICD-10-CM | POA: Diagnosis not present

## 2016-06-29 DIAGNOSIS — Z9181 History of falling: Secondary | ICD-10-CM | POA: Diagnosis not present

## 2016-06-30 DIAGNOSIS — R41841 Cognitive communication deficit: Secondary | ICD-10-CM | POA: Diagnosis not present

## 2016-06-30 DIAGNOSIS — Z9181 History of falling: Secondary | ICD-10-CM | POA: Diagnosis not present

## 2016-06-30 DIAGNOSIS — R488 Other symbolic dysfunctions: Secondary | ICD-10-CM | POA: Diagnosis not present

## 2016-06-30 DIAGNOSIS — R1312 Dysphagia, oropharyngeal phase: Secondary | ICD-10-CM | POA: Diagnosis not present

## 2016-06-30 DIAGNOSIS — R1311 Dysphagia, oral phase: Secondary | ICD-10-CM | POA: Diagnosis not present

## 2016-06-30 DIAGNOSIS — M6281 Muscle weakness (generalized): Secondary | ICD-10-CM | POA: Diagnosis not present

## 2016-07-01 DIAGNOSIS — M6281 Muscle weakness (generalized): Secondary | ICD-10-CM | POA: Diagnosis not present

## 2016-07-01 DIAGNOSIS — R1311 Dysphagia, oral phase: Secondary | ICD-10-CM | POA: Diagnosis not present

## 2016-07-01 DIAGNOSIS — R488 Other symbolic dysfunctions: Secondary | ICD-10-CM | POA: Diagnosis not present

## 2016-07-01 DIAGNOSIS — R41841 Cognitive communication deficit: Secondary | ICD-10-CM | POA: Diagnosis not present

## 2016-07-01 DIAGNOSIS — Z9181 History of falling: Secondary | ICD-10-CM | POA: Diagnosis not present

## 2016-07-01 DIAGNOSIS — R1312 Dysphagia, oropharyngeal phase: Secondary | ICD-10-CM | POA: Diagnosis not present

## 2016-07-04 DIAGNOSIS — R41841 Cognitive communication deficit: Secondary | ICD-10-CM | POA: Diagnosis not present

## 2016-07-04 DIAGNOSIS — R1311 Dysphagia, oral phase: Secondary | ICD-10-CM | POA: Diagnosis not present

## 2016-07-04 DIAGNOSIS — Z9181 History of falling: Secondary | ICD-10-CM | POA: Diagnosis not present

## 2016-07-04 DIAGNOSIS — R488 Other symbolic dysfunctions: Secondary | ICD-10-CM | POA: Diagnosis not present

## 2016-07-04 DIAGNOSIS — R1312 Dysphagia, oropharyngeal phase: Secondary | ICD-10-CM | POA: Diagnosis not present

## 2016-07-04 DIAGNOSIS — M6281 Muscle weakness (generalized): Secondary | ICD-10-CM | POA: Diagnosis not present

## 2016-07-05 DIAGNOSIS — M6281 Muscle weakness (generalized): Secondary | ICD-10-CM | POA: Diagnosis not present

## 2016-07-05 DIAGNOSIS — Z993 Dependence on wheelchair: Secondary | ICD-10-CM | POA: Diagnosis not present

## 2016-07-05 DIAGNOSIS — R1312 Dysphagia, oropharyngeal phase: Secondary | ICD-10-CM | POA: Diagnosis not present

## 2016-07-05 DIAGNOSIS — L89309 Pressure ulcer of unspecified buttock, unspecified stage: Secondary | ICD-10-CM | POA: Diagnosis not present

## 2016-07-05 DIAGNOSIS — R1311 Dysphagia, oral phase: Secondary | ICD-10-CM | POA: Diagnosis not present

## 2016-07-05 DIAGNOSIS — Z9181 History of falling: Secondary | ICD-10-CM | POA: Diagnosis not present

## 2016-07-05 DIAGNOSIS — R41841 Cognitive communication deficit: Secondary | ICD-10-CM | POA: Diagnosis not present

## 2016-07-05 DIAGNOSIS — G309 Alzheimer's disease, unspecified: Secondary | ICD-10-CM | POA: Diagnosis not present

## 2016-07-05 DIAGNOSIS — R488 Other symbolic dysfunctions: Secondary | ICD-10-CM | POA: Diagnosis not present

## 2016-07-06 DIAGNOSIS — M6281 Muscle weakness (generalized): Secondary | ICD-10-CM | POA: Diagnosis not present

## 2016-07-06 DIAGNOSIS — R1311 Dysphagia, oral phase: Secondary | ICD-10-CM | POA: Diagnosis not present

## 2016-07-06 DIAGNOSIS — R41841 Cognitive communication deficit: Secondary | ICD-10-CM | POA: Diagnosis not present

## 2016-07-06 DIAGNOSIS — R1312 Dysphagia, oropharyngeal phase: Secondary | ICD-10-CM | POA: Diagnosis not present

## 2016-07-06 DIAGNOSIS — R488 Other symbolic dysfunctions: Secondary | ICD-10-CM | POA: Diagnosis not present

## 2016-07-06 DIAGNOSIS — Z9181 History of falling: Secondary | ICD-10-CM | POA: Diagnosis not present

## 2016-07-07 DIAGNOSIS — R1312 Dysphagia, oropharyngeal phase: Secondary | ICD-10-CM | POA: Diagnosis not present

## 2016-07-07 DIAGNOSIS — R488 Other symbolic dysfunctions: Secondary | ICD-10-CM | POA: Diagnosis not present

## 2016-07-07 DIAGNOSIS — R41841 Cognitive communication deficit: Secondary | ICD-10-CM | POA: Diagnosis not present

## 2016-07-07 DIAGNOSIS — Z9181 History of falling: Secondary | ICD-10-CM | POA: Diagnosis not present

## 2016-07-07 DIAGNOSIS — M6281 Muscle weakness (generalized): Secondary | ICD-10-CM | POA: Diagnosis not present

## 2016-07-07 DIAGNOSIS — R1311 Dysphagia, oral phase: Secondary | ICD-10-CM | POA: Diagnosis not present

## 2016-07-08 DIAGNOSIS — M6281 Muscle weakness (generalized): Secondary | ICD-10-CM | POA: Diagnosis not present

## 2016-07-08 DIAGNOSIS — R1311 Dysphagia, oral phase: Secondary | ICD-10-CM | POA: Diagnosis not present

## 2016-07-08 DIAGNOSIS — R41841 Cognitive communication deficit: Secondary | ICD-10-CM | POA: Diagnosis not present

## 2016-07-08 DIAGNOSIS — R1312 Dysphagia, oropharyngeal phase: Secondary | ICD-10-CM | POA: Diagnosis not present

## 2016-07-08 DIAGNOSIS — R488 Other symbolic dysfunctions: Secondary | ICD-10-CM | POA: Diagnosis not present

## 2016-07-08 DIAGNOSIS — Z9181 History of falling: Secondary | ICD-10-CM | POA: Diagnosis not present

## 2016-07-09 DIAGNOSIS — Z9181 History of falling: Secondary | ICD-10-CM | POA: Diagnosis not present

## 2016-07-09 DIAGNOSIS — R1312 Dysphagia, oropharyngeal phase: Secondary | ICD-10-CM | POA: Diagnosis not present

## 2016-07-09 DIAGNOSIS — M6281 Muscle weakness (generalized): Secondary | ICD-10-CM | POA: Diagnosis not present

## 2016-07-09 DIAGNOSIS — R1311 Dysphagia, oral phase: Secondary | ICD-10-CM | POA: Diagnosis not present

## 2016-07-09 DIAGNOSIS — R488 Other symbolic dysfunctions: Secondary | ICD-10-CM | POA: Diagnosis not present

## 2016-07-09 DIAGNOSIS — R41841 Cognitive communication deficit: Secondary | ICD-10-CM | POA: Diagnosis not present

## 2016-07-11 DIAGNOSIS — R1311 Dysphagia, oral phase: Secondary | ICD-10-CM | POA: Diagnosis not present

## 2016-07-11 DIAGNOSIS — M6281 Muscle weakness (generalized): Secondary | ICD-10-CM | POA: Diagnosis not present

## 2016-07-11 DIAGNOSIS — R41841 Cognitive communication deficit: Secondary | ICD-10-CM | POA: Diagnosis not present

## 2016-07-11 DIAGNOSIS — R488 Other symbolic dysfunctions: Secondary | ICD-10-CM | POA: Diagnosis not present

## 2016-07-11 DIAGNOSIS — Z9181 History of falling: Secondary | ICD-10-CM | POA: Diagnosis not present

## 2016-07-11 DIAGNOSIS — R1312 Dysphagia, oropharyngeal phase: Secondary | ICD-10-CM | POA: Diagnosis not present

## 2016-07-12 DIAGNOSIS — M6281 Muscle weakness (generalized): Secondary | ICD-10-CM | POA: Diagnosis not present

## 2016-07-12 DIAGNOSIS — R1311 Dysphagia, oral phase: Secondary | ICD-10-CM | POA: Diagnosis not present

## 2016-07-12 DIAGNOSIS — Z9181 History of falling: Secondary | ICD-10-CM | POA: Diagnosis not present

## 2016-07-12 DIAGNOSIS — R1312 Dysphagia, oropharyngeal phase: Secondary | ICD-10-CM | POA: Diagnosis not present

## 2016-07-12 DIAGNOSIS — R41841 Cognitive communication deficit: Secondary | ICD-10-CM | POA: Diagnosis not present

## 2016-07-12 DIAGNOSIS — R488 Other symbolic dysfunctions: Secondary | ICD-10-CM | POA: Diagnosis not present

## 2016-07-13 DIAGNOSIS — G309 Alzheimer's disease, unspecified: Secondary | ICD-10-CM | POA: Diagnosis not present

## 2016-07-13 DIAGNOSIS — R488 Other symbolic dysfunctions: Secondary | ICD-10-CM | POA: Diagnosis not present

## 2016-07-13 DIAGNOSIS — Z9181 History of falling: Secondary | ICD-10-CM | POA: Diagnosis not present

## 2016-07-13 DIAGNOSIS — R41841 Cognitive communication deficit: Secondary | ICD-10-CM | POA: Diagnosis not present

## 2016-07-13 DIAGNOSIS — R1312 Dysphagia, oropharyngeal phase: Secondary | ICD-10-CM | POA: Diagnosis not present

## 2016-07-13 DIAGNOSIS — Z993 Dependence on wheelchair: Secondary | ICD-10-CM | POA: Diagnosis not present

## 2016-07-13 DIAGNOSIS — M6281 Muscle weakness (generalized): Secondary | ICD-10-CM | POA: Diagnosis not present

## 2016-07-13 DIAGNOSIS — R1311 Dysphagia, oral phase: Secondary | ICD-10-CM | POA: Diagnosis not present

## 2016-07-14 DIAGNOSIS — R488 Other symbolic dysfunctions: Secondary | ICD-10-CM | POA: Diagnosis not present

## 2016-07-14 DIAGNOSIS — R1312 Dysphagia, oropharyngeal phase: Secondary | ICD-10-CM | POA: Diagnosis not present

## 2016-07-14 DIAGNOSIS — Z9181 History of falling: Secondary | ICD-10-CM | POA: Diagnosis not present

## 2016-07-14 DIAGNOSIS — R1311 Dysphagia, oral phase: Secondary | ICD-10-CM | POA: Diagnosis not present

## 2016-07-14 DIAGNOSIS — R41841 Cognitive communication deficit: Secondary | ICD-10-CM | POA: Diagnosis not present

## 2016-07-14 DIAGNOSIS — M6281 Muscle weakness (generalized): Secondary | ICD-10-CM | POA: Diagnosis not present

## 2016-07-15 DIAGNOSIS — R41841 Cognitive communication deficit: Secondary | ICD-10-CM | POA: Diagnosis not present

## 2016-07-15 DIAGNOSIS — Z9181 History of falling: Secondary | ICD-10-CM | POA: Diagnosis not present

## 2016-07-15 DIAGNOSIS — M6281 Muscle weakness (generalized): Secondary | ICD-10-CM | POA: Diagnosis not present

## 2016-07-15 DIAGNOSIS — R1312 Dysphagia, oropharyngeal phase: Secondary | ICD-10-CM | POA: Diagnosis not present

## 2016-07-15 DIAGNOSIS — R1311 Dysphagia, oral phase: Secondary | ICD-10-CM | POA: Diagnosis not present

## 2016-07-15 DIAGNOSIS — R488 Other symbolic dysfunctions: Secondary | ICD-10-CM | POA: Diagnosis not present

## 2016-07-16 DIAGNOSIS — F064 Anxiety disorder due to known physiological condition: Secondary | ICD-10-CM | POA: Diagnosis not present

## 2016-07-16 DIAGNOSIS — G309 Alzheimer's disease, unspecified: Secondary | ICD-10-CM | POA: Diagnosis not present

## 2016-07-18 DIAGNOSIS — R1311 Dysphagia, oral phase: Secondary | ICD-10-CM | POA: Diagnosis not present

## 2016-07-18 DIAGNOSIS — R41841 Cognitive communication deficit: Secondary | ICD-10-CM | POA: Diagnosis not present

## 2016-07-18 DIAGNOSIS — R488 Other symbolic dysfunctions: Secondary | ICD-10-CM | POA: Diagnosis not present

## 2016-07-18 DIAGNOSIS — R1312 Dysphagia, oropharyngeal phase: Secondary | ICD-10-CM | POA: Diagnosis not present

## 2016-07-18 DIAGNOSIS — M6281 Muscle weakness (generalized): Secondary | ICD-10-CM | POA: Diagnosis not present

## 2016-07-18 DIAGNOSIS — Z9181 History of falling: Secondary | ICD-10-CM | POA: Diagnosis not present

## 2016-07-19 DIAGNOSIS — M6281 Muscle weakness (generalized): Secondary | ICD-10-CM | POA: Diagnosis not present

## 2016-07-19 DIAGNOSIS — R41841 Cognitive communication deficit: Secondary | ICD-10-CM | POA: Diagnosis not present

## 2016-07-19 DIAGNOSIS — R1311 Dysphagia, oral phase: Secondary | ICD-10-CM | POA: Diagnosis not present

## 2016-07-19 DIAGNOSIS — R1312 Dysphagia, oropharyngeal phase: Secondary | ICD-10-CM | POA: Diagnosis not present

## 2016-07-19 DIAGNOSIS — R488 Other symbolic dysfunctions: Secondary | ICD-10-CM | POA: Diagnosis not present

## 2016-07-19 DIAGNOSIS — Z9181 History of falling: Secondary | ICD-10-CM | POA: Diagnosis not present

## 2016-07-20 DIAGNOSIS — R1312 Dysphagia, oropharyngeal phase: Secondary | ICD-10-CM | POA: Diagnosis not present

## 2016-07-20 DIAGNOSIS — R488 Other symbolic dysfunctions: Secondary | ICD-10-CM | POA: Diagnosis not present

## 2016-07-20 DIAGNOSIS — Z9181 History of falling: Secondary | ICD-10-CM | POA: Diagnosis not present

## 2016-07-20 DIAGNOSIS — R1311 Dysphagia, oral phase: Secondary | ICD-10-CM | POA: Diagnosis not present

## 2016-07-20 DIAGNOSIS — R41841 Cognitive communication deficit: Secondary | ICD-10-CM | POA: Diagnosis not present

## 2016-07-20 DIAGNOSIS — M6281 Muscle weakness (generalized): Secondary | ICD-10-CM | POA: Diagnosis not present

## 2016-07-21 DIAGNOSIS — R32 Unspecified urinary incontinence: Secondary | ICD-10-CM | POA: Diagnosis not present

## 2016-07-21 DIAGNOSIS — F0280 Dementia in other diseases classified elsewhere without behavioral disturbance: Secondary | ICD-10-CM | POA: Diagnosis not present

## 2016-07-21 DIAGNOSIS — R159 Full incontinence of feces: Secondary | ICD-10-CM | POA: Diagnosis not present

## 2016-07-21 DIAGNOSIS — I10 Essential (primary) hypertension: Secondary | ICD-10-CM | POA: Diagnosis not present

## 2016-07-21 DIAGNOSIS — G309 Alzheimer's disease, unspecified: Secondary | ICD-10-CM | POA: Diagnosis not present

## 2016-07-26 DIAGNOSIS — G309 Alzheimer's disease, unspecified: Secondary | ICD-10-CM | POA: Diagnosis not present

## 2016-07-26 DIAGNOSIS — F0280 Dementia in other diseases classified elsewhere without behavioral disturbance: Secondary | ICD-10-CM | POA: Diagnosis not present

## 2016-07-26 DIAGNOSIS — I10 Essential (primary) hypertension: Secondary | ICD-10-CM | POA: Diagnosis not present

## 2016-07-26 DIAGNOSIS — R32 Unspecified urinary incontinence: Secondary | ICD-10-CM | POA: Diagnosis not present

## 2016-07-26 DIAGNOSIS — R159 Full incontinence of feces: Secondary | ICD-10-CM | POA: Diagnosis not present

## 2016-07-27 DIAGNOSIS — R634 Abnormal weight loss: Secondary | ICD-10-CM | POA: Diagnosis not present

## 2016-07-27 DIAGNOSIS — N390 Urinary tract infection, site not specified: Secondary | ICD-10-CM | POA: Diagnosis not present

## 2016-07-27 DIAGNOSIS — Z993 Dependence on wheelchair: Secondary | ICD-10-CM | POA: Diagnosis not present

## 2016-07-29 DIAGNOSIS — M6281 Muscle weakness (generalized): Secondary | ICD-10-CM | POA: Diagnosis not present

## 2016-07-29 DIAGNOSIS — M542 Cervicalgia: Secondary | ICD-10-CM | POA: Diagnosis not present

## 2016-07-29 DIAGNOSIS — R41841 Cognitive communication deficit: Secondary | ICD-10-CM | POA: Diagnosis not present

## 2016-08-01 DIAGNOSIS — M6281 Muscle weakness (generalized): Secondary | ICD-10-CM | POA: Diagnosis not present

## 2016-08-01 DIAGNOSIS — M542 Cervicalgia: Secondary | ICD-10-CM | POA: Diagnosis not present

## 2016-08-01 DIAGNOSIS — R41841 Cognitive communication deficit: Secondary | ICD-10-CM | POA: Diagnosis not present

## 2016-08-02 DIAGNOSIS — M542 Cervicalgia: Secondary | ICD-10-CM | POA: Diagnosis not present

## 2016-08-02 DIAGNOSIS — R41841 Cognitive communication deficit: Secondary | ICD-10-CM | POA: Diagnosis not present

## 2016-08-02 DIAGNOSIS — M6281 Muscle weakness (generalized): Secondary | ICD-10-CM | POA: Diagnosis not present

## 2016-08-03 DIAGNOSIS — M6281 Muscle weakness (generalized): Secondary | ICD-10-CM | POA: Diagnosis not present

## 2016-08-03 DIAGNOSIS — R41841 Cognitive communication deficit: Secondary | ICD-10-CM | POA: Diagnosis not present

## 2016-08-03 DIAGNOSIS — M542 Cervicalgia: Secondary | ICD-10-CM | POA: Diagnosis not present

## 2016-08-04 DIAGNOSIS — R41841 Cognitive communication deficit: Secondary | ICD-10-CM | POA: Diagnosis not present

## 2016-08-04 DIAGNOSIS — M6281 Muscle weakness (generalized): Secondary | ICD-10-CM | POA: Diagnosis not present

## 2016-08-04 DIAGNOSIS — M542 Cervicalgia: Secondary | ICD-10-CM | POA: Diagnosis not present

## 2016-08-05 DIAGNOSIS — R41841 Cognitive communication deficit: Secondary | ICD-10-CM | POA: Diagnosis not present

## 2016-08-05 DIAGNOSIS — M6281 Muscle weakness (generalized): Secondary | ICD-10-CM | POA: Diagnosis not present

## 2016-08-05 DIAGNOSIS — M542 Cervicalgia: Secondary | ICD-10-CM | POA: Diagnosis not present

## 2016-08-08 DIAGNOSIS — M6281 Muscle weakness (generalized): Secondary | ICD-10-CM | POA: Diagnosis not present

## 2016-08-08 DIAGNOSIS — M542 Cervicalgia: Secondary | ICD-10-CM | POA: Diagnosis not present

## 2016-08-08 DIAGNOSIS — R41841 Cognitive communication deficit: Secondary | ICD-10-CM | POA: Diagnosis not present

## 2016-08-09 DIAGNOSIS — M542 Cervicalgia: Secondary | ICD-10-CM | POA: Diagnosis not present

## 2016-08-09 DIAGNOSIS — R41841 Cognitive communication deficit: Secondary | ICD-10-CM | POA: Diagnosis not present

## 2016-08-09 DIAGNOSIS — M6281 Muscle weakness (generalized): Secondary | ICD-10-CM | POA: Diagnosis not present

## 2016-08-10 DIAGNOSIS — M6281 Muscle weakness (generalized): Secondary | ICD-10-CM | POA: Diagnosis not present

## 2016-08-10 DIAGNOSIS — M542 Cervicalgia: Secondary | ICD-10-CM | POA: Diagnosis not present

## 2016-08-10 DIAGNOSIS — R41841 Cognitive communication deficit: Secondary | ICD-10-CM | POA: Diagnosis not present

## 2016-08-11 DIAGNOSIS — M6281 Muscle weakness (generalized): Secondary | ICD-10-CM | POA: Diagnosis not present

## 2016-08-11 DIAGNOSIS — R41841 Cognitive communication deficit: Secondary | ICD-10-CM | POA: Diagnosis not present

## 2016-08-11 DIAGNOSIS — M542 Cervicalgia: Secondary | ICD-10-CM | POA: Diagnosis not present

## 2016-08-12 DIAGNOSIS — M6281 Muscle weakness (generalized): Secondary | ICD-10-CM | POA: Diagnosis not present

## 2016-08-12 DIAGNOSIS — R41841 Cognitive communication deficit: Secondary | ICD-10-CM | POA: Diagnosis not present

## 2016-08-12 DIAGNOSIS — M542 Cervicalgia: Secondary | ICD-10-CM | POA: Diagnosis not present

## 2016-08-16 DIAGNOSIS — Z993 Dependence on wheelchair: Secondary | ICD-10-CM | POA: Diagnosis not present

## 2016-08-16 DIAGNOSIS — M542 Cervicalgia: Secondary | ICD-10-CM | POA: Diagnosis not present

## 2016-08-16 DIAGNOSIS — R41841 Cognitive communication deficit: Secondary | ICD-10-CM | POA: Diagnosis not present

## 2016-08-16 DIAGNOSIS — M6281 Muscle weakness (generalized): Secondary | ICD-10-CM | POA: Diagnosis not present

## 2016-08-17 DIAGNOSIS — M6281 Muscle weakness (generalized): Secondary | ICD-10-CM | POA: Diagnosis not present

## 2016-08-17 DIAGNOSIS — M542 Cervicalgia: Secondary | ICD-10-CM | POA: Diagnosis not present

## 2016-08-17 DIAGNOSIS — R41841 Cognitive communication deficit: Secondary | ICD-10-CM | POA: Diagnosis not present

## 2016-08-18 DIAGNOSIS — M6281 Muscle weakness (generalized): Secondary | ICD-10-CM | POA: Diagnosis not present

## 2016-08-18 DIAGNOSIS — M542 Cervicalgia: Secondary | ICD-10-CM | POA: Diagnosis not present

## 2016-08-18 DIAGNOSIS — R41841 Cognitive communication deficit: Secondary | ICD-10-CM | POA: Diagnosis not present

## 2016-08-22 DIAGNOSIS — M6281 Muscle weakness (generalized): Secondary | ICD-10-CM | POA: Diagnosis not present

## 2016-08-22 DIAGNOSIS — M542 Cervicalgia: Secondary | ICD-10-CM | POA: Diagnosis not present

## 2016-08-22 DIAGNOSIS — R41841 Cognitive communication deficit: Secondary | ICD-10-CM | POA: Diagnosis not present

## 2016-08-24 DIAGNOSIS — F064 Anxiety disorder due to known physiological condition: Secondary | ICD-10-CM | POA: Diagnosis not present

## 2016-08-24 DIAGNOSIS — R41841 Cognitive communication deficit: Secondary | ICD-10-CM | POA: Diagnosis not present

## 2016-08-24 DIAGNOSIS — M542 Cervicalgia: Secondary | ICD-10-CM | POA: Diagnosis not present

## 2016-08-24 DIAGNOSIS — N390 Urinary tract infection, site not specified: Secondary | ICD-10-CM | POA: Diagnosis not present

## 2016-08-24 DIAGNOSIS — G309 Alzheimer's disease, unspecified: Secondary | ICD-10-CM | POA: Diagnosis not present

## 2016-08-24 DIAGNOSIS — M6281 Muscle weakness (generalized): Secondary | ICD-10-CM | POA: Diagnosis not present

## 2016-08-26 DIAGNOSIS — M542 Cervicalgia: Secondary | ICD-10-CM | POA: Diagnosis not present

## 2016-08-26 DIAGNOSIS — R41841 Cognitive communication deficit: Secondary | ICD-10-CM | POA: Diagnosis not present

## 2016-08-26 DIAGNOSIS — M6281 Muscle weakness (generalized): Secondary | ICD-10-CM | POA: Diagnosis not present

## 2016-08-29 DIAGNOSIS — M6281 Muscle weakness (generalized): Secondary | ICD-10-CM | POA: Diagnosis not present

## 2016-08-29 DIAGNOSIS — M542 Cervicalgia: Secondary | ICD-10-CM | POA: Diagnosis not present

## 2016-08-29 DIAGNOSIS — R41841 Cognitive communication deficit: Secondary | ICD-10-CM | POA: Diagnosis not present

## 2016-08-31 DIAGNOSIS — M6281 Muscle weakness (generalized): Secondary | ICD-10-CM | POA: Diagnosis not present

## 2016-08-31 DIAGNOSIS — M542 Cervicalgia: Secondary | ICD-10-CM | POA: Diagnosis not present

## 2016-08-31 DIAGNOSIS — R41841 Cognitive communication deficit: Secondary | ICD-10-CM | POA: Diagnosis not present

## 2016-09-06 DIAGNOSIS — M542 Cervicalgia: Secondary | ICD-10-CM | POA: Diagnosis not present

## 2016-09-06 DIAGNOSIS — R41841 Cognitive communication deficit: Secondary | ICD-10-CM | POA: Diagnosis not present

## 2016-09-06 DIAGNOSIS — M6281 Muscle weakness (generalized): Secondary | ICD-10-CM | POA: Diagnosis not present

## 2016-09-07 DIAGNOSIS — F064 Anxiety disorder due to known physiological condition: Secondary | ICD-10-CM | POA: Diagnosis not present

## 2016-09-07 DIAGNOSIS — G309 Alzheimer's disease, unspecified: Secondary | ICD-10-CM | POA: Diagnosis not present

## 2016-09-09 DIAGNOSIS — M6281 Muscle weakness (generalized): Secondary | ICD-10-CM | POA: Diagnosis not present

## 2016-09-09 DIAGNOSIS — M542 Cervicalgia: Secondary | ICD-10-CM | POA: Diagnosis not present

## 2016-09-09 DIAGNOSIS — R41841 Cognitive communication deficit: Secondary | ICD-10-CM | POA: Diagnosis not present

## 2016-09-11 DIAGNOSIS — R41841 Cognitive communication deficit: Secondary | ICD-10-CM | POA: Diagnosis not present

## 2016-09-11 DIAGNOSIS — M542 Cervicalgia: Secondary | ICD-10-CM | POA: Diagnosis not present

## 2016-09-11 DIAGNOSIS — M6281 Muscle weakness (generalized): Secondary | ICD-10-CM | POA: Diagnosis not present

## 2016-09-14 DIAGNOSIS — M6281 Muscle weakness (generalized): Secondary | ICD-10-CM | POA: Diagnosis not present

## 2016-09-14 DIAGNOSIS — Z993 Dependence on wheelchair: Secondary | ICD-10-CM | POA: Diagnosis not present

## 2016-09-21 DIAGNOSIS — F064 Anxiety disorder due to known physiological condition: Secondary | ICD-10-CM | POA: Diagnosis not present

## 2016-09-21 DIAGNOSIS — R634 Abnormal weight loss: Secondary | ICD-10-CM | POA: Diagnosis not present

## 2016-09-21 DIAGNOSIS — G309 Alzheimer's disease, unspecified: Secondary | ICD-10-CM | POA: Diagnosis not present

## 2016-09-21 DIAGNOSIS — M6281 Muscle weakness (generalized): Secondary | ICD-10-CM | POA: Diagnosis not present

## 2016-09-23 DIAGNOSIS — R41841 Cognitive communication deficit: Secondary | ICD-10-CM | POA: Diagnosis not present

## 2016-09-26 DIAGNOSIS — R41841 Cognitive communication deficit: Secondary | ICD-10-CM | POA: Diagnosis not present

## 2016-10-12 DIAGNOSIS — F064 Anxiety disorder due to known physiological condition: Secondary | ICD-10-CM | POA: Diagnosis not present

## 2016-10-12 DIAGNOSIS — G309 Alzheimer's disease, unspecified: Secondary | ICD-10-CM | POA: Diagnosis not present

## 2016-10-12 DIAGNOSIS — M6281 Muscle weakness (generalized): Secondary | ICD-10-CM | POA: Diagnosis not present

## 2016-10-17 DIAGNOSIS — Z993 Dependence on wheelchair: Secondary | ICD-10-CM | POA: Diagnosis not present

## 2016-10-17 DIAGNOSIS — M6281 Muscle weakness (generalized): Secondary | ICD-10-CM | POA: Diagnosis not present

## 2016-10-19 DIAGNOSIS — M6281 Muscle weakness (generalized): Secondary | ICD-10-CM | POA: Diagnosis not present

## 2016-10-19 DIAGNOSIS — L89309 Pressure ulcer of unspecified buttock, unspecified stage: Secondary | ICD-10-CM | POA: Diagnosis not present

## 2016-10-19 DIAGNOSIS — R32 Unspecified urinary incontinence: Secondary | ICD-10-CM | POA: Diagnosis not present

## 2016-11-10 DIAGNOSIS — G309 Alzheimer's disease, unspecified: Secondary | ICD-10-CM | POA: Diagnosis not present

## 2016-11-10 DIAGNOSIS — F064 Anxiety disorder due to known physiological condition: Secondary | ICD-10-CM | POA: Diagnosis not present

## 2016-11-16 DIAGNOSIS — R634 Abnormal weight loss: Secondary | ICD-10-CM | POA: Diagnosis not present

## 2016-11-16 DIAGNOSIS — R32 Unspecified urinary incontinence: Secondary | ICD-10-CM | POA: Diagnosis not present

## 2016-11-16 DIAGNOSIS — M6281 Muscle weakness (generalized): Secondary | ICD-10-CM | POA: Diagnosis not present

## 2016-11-16 DIAGNOSIS — Z993 Dependence on wheelchair: Secondary | ICD-10-CM | POA: Diagnosis not present

## 2016-11-17 DIAGNOSIS — R41841 Cognitive communication deficit: Secondary | ICD-10-CM | POA: Diagnosis not present

## 2016-11-29 DIAGNOSIS — R634 Abnormal weight loss: Secondary | ICD-10-CM | POA: Diagnosis not present

## 2016-11-29 DIAGNOSIS — M6281 Muscle weakness (generalized): Secondary | ICD-10-CM | POA: Diagnosis not present

## 2016-11-29 DIAGNOSIS — L89309 Pressure ulcer of unspecified buttock, unspecified stage: Secondary | ICD-10-CM | POA: Diagnosis not present

## 2016-11-29 DIAGNOSIS — Z993 Dependence on wheelchair: Secondary | ICD-10-CM | POA: Diagnosis not present

## 2016-11-29 DIAGNOSIS — R32 Unspecified urinary incontinence: Secondary | ICD-10-CM | POA: Diagnosis not present

## 2016-12-08 DIAGNOSIS — G301 Alzheimer's disease with late onset: Secondary | ICD-10-CM | POA: Diagnosis not present

## 2016-12-08 DIAGNOSIS — F0280 Dementia in other diseases classified elsewhere without behavioral disturbance: Secondary | ICD-10-CM | POA: Diagnosis not present

## 2016-12-08 DIAGNOSIS — F064 Anxiety disorder due to known physiological condition: Secondary | ICD-10-CM | POA: Diagnosis not present

## 2016-12-14 DIAGNOSIS — G309 Alzheimer's disease, unspecified: Secondary | ICD-10-CM | POA: Diagnosis not present

## 2016-12-14 DIAGNOSIS — L89309 Pressure ulcer of unspecified buttock, unspecified stage: Secondary | ICD-10-CM | POA: Diagnosis not present

## 2016-12-14 DIAGNOSIS — M6281 Muscle weakness (generalized): Secondary | ICD-10-CM | POA: Diagnosis not present

## 2016-12-14 DIAGNOSIS — R634 Abnormal weight loss: Secondary | ICD-10-CM | POA: Diagnosis not present

## 2016-12-14 DIAGNOSIS — Z993 Dependence on wheelchair: Secondary | ICD-10-CM | POA: Diagnosis not present

## 2016-12-14 DIAGNOSIS — R32 Unspecified urinary incontinence: Secondary | ICD-10-CM | POA: Diagnosis not present

## 2016-12-15 DIAGNOSIS — Z993 Dependence on wheelchair: Secondary | ICD-10-CM | POA: Diagnosis not present

## 2016-12-15 DIAGNOSIS — M6281 Muscle weakness (generalized): Secondary | ICD-10-CM | POA: Diagnosis not present

## 2016-12-15 DIAGNOSIS — R32 Unspecified urinary incontinence: Secondary | ICD-10-CM | POA: Diagnosis not present

## 2017-01-03 DIAGNOSIS — L814 Other melanin hyperpigmentation: Secondary | ICD-10-CM | POA: Diagnosis not present

## 2017-01-03 DIAGNOSIS — D1801 Hemangioma of skin and subcutaneous tissue: Secondary | ICD-10-CM | POA: Diagnosis not present

## 2017-01-03 DIAGNOSIS — L821 Other seborrheic keratosis: Secondary | ICD-10-CM | POA: Diagnosis not present

## 2017-01-11 DIAGNOSIS — Z993 Dependence on wheelchair: Secondary | ICD-10-CM | POA: Diagnosis not present

## 2017-01-11 DIAGNOSIS — M6281 Muscle weakness (generalized): Secondary | ICD-10-CM | POA: Diagnosis not present

## 2017-01-11 DIAGNOSIS — G309 Alzheimer's disease, unspecified: Secondary | ICD-10-CM | POA: Diagnosis not present

## 2017-01-11 DIAGNOSIS — R32 Unspecified urinary incontinence: Secondary | ICD-10-CM | POA: Diagnosis not present

## 2017-01-11 DIAGNOSIS — L89309 Pressure ulcer of unspecified buttock, unspecified stage: Secondary | ICD-10-CM | POA: Diagnosis not present

## 2017-01-11 DIAGNOSIS — R634 Abnormal weight loss: Secondary | ICD-10-CM | POA: Diagnosis not present

## 2017-01-12 DIAGNOSIS — F0280 Dementia in other diseases classified elsewhere without behavioral disturbance: Secondary | ICD-10-CM | POA: Diagnosis not present

## 2017-01-12 DIAGNOSIS — F064 Anxiety disorder due to known physiological condition: Secondary | ICD-10-CM | POA: Diagnosis not present

## 2017-01-12 DIAGNOSIS — F331 Major depressive disorder, recurrent, moderate: Secondary | ICD-10-CM | POA: Diagnosis not present

## 2017-01-12 DIAGNOSIS — G301 Alzheimer's disease with late onset: Secondary | ICD-10-CM | POA: Diagnosis not present

## 2017-01-17 DIAGNOSIS — M2010 Hallux valgus (acquired), unspecified foot: Secondary | ICD-10-CM | POA: Diagnosis not present

## 2017-01-17 DIAGNOSIS — L603 Nail dystrophy: Secondary | ICD-10-CM | POA: Diagnosis not present

## 2017-01-17 DIAGNOSIS — B351 Tinea unguium: Secondary | ICD-10-CM | POA: Diagnosis not present

## 2017-02-08 DIAGNOSIS — M6281 Muscle weakness (generalized): Secondary | ICD-10-CM | POA: Diagnosis not present

## 2017-02-08 DIAGNOSIS — R634 Abnormal weight loss: Secondary | ICD-10-CM | POA: Diagnosis not present

## 2017-02-08 DIAGNOSIS — G309 Alzheimer's disease, unspecified: Secondary | ICD-10-CM | POA: Diagnosis not present

## 2017-02-08 DIAGNOSIS — R32 Unspecified urinary incontinence: Secondary | ICD-10-CM | POA: Diagnosis not present

## 2017-02-08 DIAGNOSIS — L89309 Pressure ulcer of unspecified buttock, unspecified stage: Secondary | ICD-10-CM | POA: Diagnosis not present

## 2017-02-08 DIAGNOSIS — Z993 Dependence on wheelchair: Secondary | ICD-10-CM | POA: Diagnosis not present

## 2017-02-16 DIAGNOSIS — F064 Anxiety disorder due to known physiological condition: Secondary | ICD-10-CM | POA: Diagnosis not present

## 2017-02-16 DIAGNOSIS — F0280 Dementia in other diseases classified elsewhere without behavioral disturbance: Secondary | ICD-10-CM | POA: Diagnosis not present

## 2017-02-16 DIAGNOSIS — G301 Alzheimer's disease with late onset: Secondary | ICD-10-CM | POA: Diagnosis not present

## 2017-02-16 DIAGNOSIS — F331 Major depressive disorder, recurrent, moderate: Secondary | ICD-10-CM | POA: Diagnosis not present

## 2017-03-08 DIAGNOSIS — M6281 Muscle weakness (generalized): Secondary | ICD-10-CM | POA: Diagnosis not present

## 2017-03-08 DIAGNOSIS — L89309 Pressure ulcer of unspecified buttock, unspecified stage: Secondary | ICD-10-CM | POA: Diagnosis not present

## 2017-03-08 DIAGNOSIS — R634 Abnormal weight loss: Secondary | ICD-10-CM | POA: Diagnosis not present

## 2018-04-20 ENCOUNTER — Inpatient Hospital Stay (HOSPITAL_COMMUNITY)
Admission: EM | Admit: 2018-04-20 | Discharge: 2018-04-25 | DRG: 445 | Disposition: A | Discharge status: Nursing Home (Includes ICF) | Payer: Medicare Other | Source: Skilled Nursing Facility | Attending: Internal Medicine | Admitting: Internal Medicine

## 2018-04-20 ENCOUNTER — Emergency Department (HOSPITAL_COMMUNITY): Payer: Medicare Other

## 2018-04-20 ENCOUNTER — Encounter (HOSPITAL_COMMUNITY): Payer: Self-pay | Admitting: Emergency Medicine

## 2018-04-20 ENCOUNTER — Other Ambulatory Visit: Payer: Self-pay

## 2018-04-20 DIAGNOSIS — F0281 Dementia in other diseases classified elsewhere with behavioral disturbance: Secondary | ICD-10-CM | POA: Diagnosis present

## 2018-04-20 DIAGNOSIS — R109 Unspecified abdominal pain: Secondary | ICD-10-CM

## 2018-04-20 DIAGNOSIS — F0280 Dementia in other diseases classified elsewhere without behavioral disturbance: Secondary | ICD-10-CM

## 2018-04-20 DIAGNOSIS — Z993 Dependence on wheelchair: Secondary | ICD-10-CM

## 2018-04-20 DIAGNOSIS — Z87440 Personal history of urinary (tract) infections: Secondary | ICD-10-CM

## 2018-04-20 DIAGNOSIS — G309 Alzheimer's disease, unspecified: Secondary | ICD-10-CM | POA: Diagnosis present

## 2018-04-20 DIAGNOSIS — D649 Anemia, unspecified: Secondary | ICD-10-CM | POA: Diagnosis present

## 2018-04-20 DIAGNOSIS — Z6830 Body mass index (BMI) 30.0-30.9, adult: Secondary | ICD-10-CM

## 2018-04-20 DIAGNOSIS — F0391 Unspecified dementia with behavioral disturbance: Secondary | ICD-10-CM | POA: Diagnosis present

## 2018-04-20 DIAGNOSIS — N390 Urinary tract infection, site not specified: Secondary | ICD-10-CM | POA: Diagnosis present

## 2018-04-20 DIAGNOSIS — R748 Abnormal levels of other serum enzymes: Secondary | ICD-10-CM

## 2018-04-20 DIAGNOSIS — K8050 Calculus of bile duct without cholangitis or cholecystitis without obstruction: Secondary | ICD-10-CM

## 2018-04-20 DIAGNOSIS — D696 Thrombocytopenia, unspecified: Secondary | ICD-10-CM | POA: Diagnosis present

## 2018-04-20 DIAGNOSIS — E876 Hypokalemia: Secondary | ICD-10-CM | POA: Diagnosis present

## 2018-04-20 DIAGNOSIS — K5900 Constipation, unspecified: Secondary | ICD-10-CM | POA: Diagnosis present

## 2018-04-20 DIAGNOSIS — F03918 Unspecified dementia, unspecified severity, with other behavioral disturbance: Secondary | ICD-10-CM | POA: Diagnosis present

## 2018-04-20 DIAGNOSIS — F319 Bipolar disorder, unspecified: Secondary | ICD-10-CM | POA: Diagnosis present

## 2018-04-20 DIAGNOSIS — R509 Fever, unspecified: Secondary | ICD-10-CM | POA: Diagnosis not present

## 2018-04-20 DIAGNOSIS — B964 Proteus (mirabilis) (morganii) as the cause of diseases classified elsewhere: Secondary | ICD-10-CM | POA: Diagnosis present

## 2018-04-20 DIAGNOSIS — Z79899 Other long term (current) drug therapy: Secondary | ICD-10-CM

## 2018-04-20 DIAGNOSIS — E669 Obesity, unspecified: Secondary | ICD-10-CM | POA: Diagnosis present

## 2018-04-20 DIAGNOSIS — Z87891 Personal history of nicotine dependence: Secondary | ICD-10-CM

## 2018-04-20 DIAGNOSIS — R52 Pain, unspecified: Secondary | ICD-10-CM

## 2018-04-20 DIAGNOSIS — K8070 Calculus of gallbladder and bile duct without cholecystitis without obstruction: Principal | ICD-10-CM | POA: Diagnosis present

## 2018-04-20 DIAGNOSIS — Z66 Do not resuscitate: Secondary | ICD-10-CM | POA: Diagnosis present

## 2018-04-20 HISTORY — DX: Anorexia: R63.0

## 2018-04-20 MED ORDER — SODIUM CHLORIDE 0.9% FLUSH: 3.0000 mL | Freq: Once | INTRAVENOUS | Status: DC

## 2018-04-20 NOTE — ED Notes (Signed)
Bed: FU07 Expected date:  Expected time:  Means of arrival:  Comments: EMS 85 y/o/f-fever

## 2018-04-20 NOTE — ED Triage Notes (Signed)
Patient brought in by EMS. Patient temp was taken and was 101.9 at facility. Patient was not n/v/d. Patient is non verbal due to alzheimer.

## 2018-04-21 ENCOUNTER — Encounter (HOSPITAL_COMMUNITY): Payer: Self-pay

## 2018-04-21 ENCOUNTER — Emergency Department (HOSPITAL_COMMUNITY): Payer: Medicare Other

## 2018-04-21 ENCOUNTER — Inpatient Hospital Stay (HOSPITAL_COMMUNITY): Payer: Medicare Other

## 2018-04-21 DIAGNOSIS — Z79899 Other long term (current) drug therapy: Secondary | ICD-10-CM | POA: Diagnosis not present

## 2018-04-21 DIAGNOSIS — G309 Alzheimer's disease, unspecified: Secondary | ICD-10-CM | POA: Diagnosis present

## 2018-04-21 DIAGNOSIS — R748 Abnormal levels of other serum enzymes: Secondary | ICD-10-CM

## 2018-04-21 DIAGNOSIS — R509 Fever, unspecified: Secondary | ICD-10-CM | POA: Diagnosis present

## 2018-04-21 DIAGNOSIS — Z6830 Body mass index (BMI) 30.0-30.9, adult: Secondary | ICD-10-CM | POA: Diagnosis not present

## 2018-04-21 DIAGNOSIS — B964 Proteus (mirabilis) (morganii) as the cause of diseases classified elsewhere: Secondary | ICD-10-CM | POA: Diagnosis present

## 2018-04-21 DIAGNOSIS — F319 Bipolar disorder, unspecified: Secondary | ICD-10-CM | POA: Diagnosis present

## 2018-04-21 DIAGNOSIS — F0280 Dementia in other diseases classified elsewhere without behavioral disturbance: Secondary | ICD-10-CM

## 2018-04-21 DIAGNOSIS — K5900 Constipation, unspecified: Secondary | ICD-10-CM | POA: Diagnosis present

## 2018-04-21 DIAGNOSIS — N390 Urinary tract infection, site not specified: Secondary | ICD-10-CM | POA: Diagnosis present

## 2018-04-21 DIAGNOSIS — F0281 Dementia in other diseases classified elsewhere with behavioral disturbance: Secondary | ICD-10-CM | POA: Diagnosis present

## 2018-04-21 DIAGNOSIS — Z993 Dependence on wheelchair: Secondary | ICD-10-CM | POA: Diagnosis not present

## 2018-04-21 DIAGNOSIS — E876 Hypokalemia: Secondary | ICD-10-CM

## 2018-04-21 DIAGNOSIS — Z66 Do not resuscitate: Secondary | ICD-10-CM | POA: Diagnosis present

## 2018-04-21 DIAGNOSIS — E669 Obesity, unspecified: Secondary | ICD-10-CM | POA: Diagnosis present

## 2018-04-21 DIAGNOSIS — Z87891 Personal history of nicotine dependence: Secondary | ICD-10-CM | POA: Diagnosis not present

## 2018-04-21 DIAGNOSIS — D649 Anemia, unspecified: Secondary | ICD-10-CM | POA: Diagnosis present

## 2018-04-21 DIAGNOSIS — Z87440 Personal history of urinary (tract) infections: Secondary | ICD-10-CM | POA: Diagnosis not present

## 2018-04-21 DIAGNOSIS — K8070 Calculus of gallbladder and bile duct without cholecystitis without obstruction: Secondary | ICD-10-CM | POA: Diagnosis present

## 2018-04-21 DIAGNOSIS — D696 Thrombocytopenia, unspecified: Secondary | ICD-10-CM | POA: Diagnosis present

## 2018-04-21 LAB — COMPREHENSIVE METABOLIC PANEL WITH GFR
ALT: 790 U/L — ABNORMAL HIGH (ref 0–44)
AST: 293 U/L — ABNORMAL HIGH (ref 15–41)
Albumin: 3.6 g/dL (ref 3.5–5.0)
Alkaline Phosphatase: 156 U/L — ABNORMAL HIGH (ref 38–126)
Anion gap: 10 (ref 5–15)
BUN: 25 mg/dL — ABNORMAL HIGH (ref 8–23)
CO2: 25 mmol/L (ref 22–32)
Calcium: 9 mg/dL (ref 8.9–10.3)
Chloride: 107 mmol/L (ref 98–111)
Creatinine, Ser: 0.54 mg/dL (ref 0.44–1.00)
GFR calc Af Amer: >60 mL/min (ref >60)
GFR calc non Af Amer: >60 mL/min (ref >60)
Glucose, Bld: 119 mg/dL — ABNORMAL HIGH (ref 70–99)
Potassium: 3.2 mmol/L — ABNORMAL LOW (ref 3.5–5.1)
Sodium: 142 mmol/L (ref 135–145)
Total Bilirubin: 2.9 mg/dL — ABNORMAL HIGH (ref 0.3–1.2)
Total Protein: 7 g/dL (ref 6.5–8.1)

## 2018-04-21 LAB — CBC WITH DIFFERENTIAL/PLATELET
Abs Immature Granulocytes: 0.03 10*3/uL (ref 0.00–0.07)
Basophils Absolute: 0 10*3/uL (ref 0.0–0.1)
Basophils Relative: 0 %
Eosinophils Absolute: 0 10*3/uL (ref 0.0–0.5)
Eosinophils Relative: 0 %
HCT: 40.3 % (ref 36.0–46.0)
Hemoglobin: 12.6 g/dL (ref 12.0–15.0)
Immature Granulocytes: 0 %
LYMPHS ABS: 0.4 10*3/uL — AB (ref 0.7–4.0)
Lymphocytes Relative: 5 %
MCH: 30.1 pg (ref 26.0–34.0)
MCHC: 31.3 g/dL (ref 30.0–36.0)
MCV: 96.2 fL (ref 80.0–100.0)
MONOS PCT: 7 %
Monocytes Absolute: 0.5 10*3/uL (ref 0.1–1.0)
Neutro Abs: 6.4 10*3/uL (ref 1.7–7.7)
Neutrophils Relative %: 88 %
Platelets: 133 10*3/uL — ABNORMAL LOW (ref 150–400)
RBC: 4.19 MIL/uL (ref 3.87–5.11)
RDW: 14.2 % (ref 11.5–15.5)
WBC: 7.4 10*3/uL (ref 4.0–10.5)
nRBC: 0 % (ref 0.0–0.2)

## 2018-04-21 LAB — URINALYSIS, ROUTINE W REFLEX MICROSCOPIC
Bilirubin Urine: NEGATIVE
Glucose, UA: NEGATIVE mg/dL
Hgb urine dipstick: NEGATIVE
Ketones, ur: NEGATIVE mg/dL
Nitrite: NEGATIVE
Protein, ur: 100 mg/dL — AB
Specific Gravity, Urine: 1.021 (ref 1.005–1.030)
WBC, UA: >50 WBC/hpf — ABNORMAL HIGH (ref 0–5)
pH: 7 (ref 5.0–8.0)

## 2018-04-21 LAB — BLOOD CULTURE ID PANEL (REFLEXED)
Acinetobacter baumannii: NOT DETECTED
CANDIDA KRUSEI: NOT DETECTED
CARBAPENEM RESISTANCE: NOT DETECTED
Candida albicans: NOT DETECTED
Candida glabrata: NOT DETECTED
Candida parapsilosis: NOT DETECTED
Candida tropicalis: NOT DETECTED
ENTEROBACTERIACEAE SPECIES: DETECTED — AB
Enterobacter cloacae complex: DETECTED — AB
Enterococcus species: NOT DETECTED
Escherichia coli: NOT DETECTED
Haemophilus influenzae: NOT DETECTED
Klebsiella oxytoca: NOT DETECTED
Klebsiella pneumoniae: NOT DETECTED
Listeria monocytogenes: NOT DETECTED
Neisseria meningitidis: NOT DETECTED
Proteus species: NOT DETECTED
Pseudomonas aeruginosa: NOT DETECTED
STAPHYLOCOCCUS AUREUS BCID: NOT DETECTED
Serratia marcescens: NOT DETECTED
Staphylococcus species: NOT DETECTED
Streptococcus agalactiae: NOT DETECTED
Streptococcus pneumoniae: NOT DETECTED
Streptococcus pyogenes: NOT DETECTED
Streptococcus species: NOT DETECTED

## 2018-04-21 LAB — PROTIME-INR
INR: 1.1
Prothrombin Time: 14.2 seconds (ref 11.4–15.2)

## 2018-04-21 LAB — LACTIC ACID, PLASMA: Lactic Acid, Venous: 1.3 mmol/L (ref 0.5–1.9)

## 2018-04-21 LAB — MAGNESIUM: Magnesium: 2.2 mg/dL (ref 1.7–2.4)

## 2018-04-21 LAB — VITAMIN B12: Vitamin B-12: 548 pg/mL (ref 180–914)

## 2018-04-21 LAB — AMMONIA: Ammonia: 31 umol/L (ref 9–35)

## 2018-04-21 LAB — MRSA PCR SCREENING: MRSA by PCR: NEGATIVE

## 2018-04-21 MED ORDER — DONEPEZIL HCL 5 MG PO TABS: 10.0000 mg | ORAL_TABLET | Freq: Every day | ORAL | Status: DC

## 2018-04-21 MED ORDER — SERTRALINE HCL 25 MG PO TABS
25.0000 mg | ORAL_TABLET | Freq: Every day | ORAL | Status: DC
  Administered 2018-04-21 – 2018-04-24 (×3): 25 mg via ORAL
  Filled 2018-04-21 (×4): qty 1

## 2018-04-21 MED ORDER — METRONIDAZOLE IN NACL 5-0.79 MG/ML-% IV SOLN
500.0000 mg | Freq: Three times a day (TID) | INTRAVENOUS | Status: DC
  Administered 2018-04-21 – 2018-04-23 (×6): 500 mg via INTRAVENOUS
  Filled 2018-04-21 (×6): qty 100

## 2018-04-21 MED ORDER — KETOROLAC TROMETHAMINE 15 MG/ML IJ SOLN: 15.0000 mg | Freq: Three times a day (TID) | INTRAMUSCULAR | Status: AC | PRN

## 2018-04-21 MED ORDER — POTASSIUM CHLORIDE 10 MEQ/100ML IV SOLN
10.0000 meq | INTRAVENOUS | Status: AC
  Administered 2018-04-21 (×2): 10 meq via INTRAVENOUS
  Filled 2018-04-21 (×2): qty 100

## 2018-04-21 MED ORDER — POTASSIUM CHLORIDE IN NACL 20-0.9 MEQ/L-% IV SOLN
INTRAVENOUS | Status: DC
  Administered 2018-04-21 – 2018-04-23 (×4): via INTRAVENOUS
  Filled 2018-04-21 (×6): qty 1000

## 2018-04-21 MED ORDER — IOPAMIDOL (ISOVUE-300) INJECTION 61%
100.0000 mL | Freq: Once | INTRAVENOUS | Status: AC | PRN
  Administered 2018-04-21: 100 mL via INTRAVENOUS

## 2018-04-21 MED ORDER — IBUPROFEN 200 MG PO TABS
400.0000 mg | ORAL_TABLET | Freq: Three times a day (TID) | ORAL | Status: DC | PRN
  Filled 2018-04-21: qty 2

## 2018-04-21 MED ORDER — INDOMETHACIN 50 MG RE SUPP: 100.0000 mg | Freq: Once | RECTAL | Status: DC

## 2018-04-21 MED ORDER — MEMANTINE HCL ER 28 MG PO CP24: 28.0000 mg | ORAL_CAPSULE | Freq: Every day | ORAL | Status: DC

## 2018-04-21 MED ORDER — SODIUM CHLORIDE 0.9 % IV SOLN
2.0000 g | Freq: Once | INTRAVENOUS | Status: AC
  Administered 2018-04-21: 2 g via INTRAVENOUS
  Filled 2018-04-21: qty 2

## 2018-04-21 MED ORDER — SODIUM CHLORIDE 0.9 % IV SOLN: INTRAVENOUS | Status: DC

## 2018-04-21 MED ORDER — POLYETHYLENE GLYCOL 3350 17 G PO PACK
17.0000 g | PACK | Freq: Every day | ORAL | Status: DC
  Administered 2018-04-21: 17 g via ORAL
  Filled 2018-04-21: qty 1

## 2018-04-21 MED ORDER — ENOXAPARIN SODIUM 40 MG/0.4ML ~~LOC~~ SOLN
40.0000 mg | SUBCUTANEOUS | Status: DC
  Administered 2018-04-23 – 2018-04-24 (×2): 40 mg via SUBCUTANEOUS
  Filled 2018-04-21 (×3): qty 0.4

## 2018-04-21 MED ORDER — CIPROFLOXACIN IN D5W 400 MG/200ML IV SOLN: 400.0000 mg | Freq: Once | INTRAVENOUS | Status: DC

## 2018-04-21 MED ORDER — IOPAMIDOL (ISOVUE-300) INJECTION 61%
INTRAVENOUS | Status: AC
  Filled 2018-04-21: qty 100

## 2018-04-21 MED ORDER — METRONIDAZOLE IN NACL 5-0.79 MG/ML-% IV SOLN
500.0000 mg | Freq: Three times a day (TID) | INTRAVENOUS | Status: DC
  Administered 2018-04-21: 500 mg via INTRAVENOUS
  Filled 2018-04-21: qty 100

## 2018-04-21 MED ORDER — CIPROFLOXACIN IN D5W 400 MG/200ML IV SOLN
400.0000 mg | Freq: Once | INTRAVENOUS | Status: AC
  Filled 2018-04-21: qty 200

## 2018-04-21 MED ORDER — ONDANSETRON HCL 4 MG PO TABS: 4.0000 mg | ORAL_TABLET | Freq: Four times a day (QID) | ORAL | Status: DC | PRN

## 2018-04-21 MED ORDER — SODIUM CHLORIDE 0.9 % IV SOLN
1.0000 g | Freq: Two times a day (BID) | INTRAVENOUS | Status: DC
  Administered 2018-04-21 – 2018-04-22 (×4): 1 g via INTRAVENOUS
  Filled 2018-04-21 (×5): qty 1

## 2018-04-21 MED ORDER — SODIUM CHLORIDE 0.9 % IV BOLUS
500.0000 mL | Freq: Once | INTRAVENOUS | Status: AC
  Administered 2018-04-21: 500 mL via INTRAVENOUS

## 2018-04-21 MED ORDER — IBUPROFEN 200 MG PO TABS
400.0000 mg | ORAL_TABLET | Freq: Three times a day (TID) | ORAL | Status: AC | PRN
  Administered 2018-04-21: 400 mg via ORAL

## 2018-04-21 MED ORDER — BISACODYL 10 MG RE SUPP
10.0000 mg | Freq: Once | RECTAL | Status: AC
  Administered 2018-04-21: 10 mg via RECTAL
  Filled 2018-04-21: qty 1

## 2018-04-21 MED ORDER — SODIUM CHLORIDE (PF) 0.9 % IJ SOLN
INTRAMUSCULAR | Status: AC
  Filled 2018-04-21: qty 50

## 2018-04-21 MED ORDER — MIRTAZAPINE 15 MG PO TABS
15.0000 mg | ORAL_TABLET | Freq: Every day | ORAL | Status: DC
  Administered 2018-04-21 – 2018-04-24 (×3): 15 mg via ORAL
  Filled 2018-04-21 (×4): qty 1

## 2018-04-21 MED ORDER — BIOTENE DRY MOUTH MT LIQD: 15.0000 mL | OROMUCOSAL | Status: DC | PRN

## 2018-04-21 MED ORDER — SENNA 8.6 MG PO TABS
1.0000 | ORAL_TABLET | Freq: Two times a day (BID) | ORAL | Status: DC
  Administered 2018-04-21 (×2): 8.6 mg via ORAL
  Filled 2018-04-21 (×5): qty 1

## 2018-04-21 MED ORDER — BISACODYL 10 MG RE SUPP: 10.0000 mg | Freq: Every day | RECTAL | Status: DC | PRN

## 2018-04-21 MED ORDER — VANCOMYCIN HCL IN DEXTROSE 1-5 GM/200ML-% IV SOLN
1000.0000 mg | Freq: Once | INTRAVENOUS | Status: AC
  Administered 2018-04-21: 1000 mg via INTRAVENOUS
  Filled 2018-04-21: qty 200

## 2018-04-21 MED ORDER — ONDANSETRON HCL 4 MG/2ML IJ SOLN: 4.0000 mg | Freq: Four times a day (QID) | INTRAMUSCULAR | Status: DC | PRN

## 2018-04-21 NOTE — ED Notes (Signed)
Wendy Hammond (Son) [phone]

## 2018-04-21 NOTE — ED Provider Notes (Addendum)
[facility] DEPT Provider Note   CSN: [id number] Arrival date & time: 04/20/18  2319     History   Chief Complaint Chief Complaint  Patient presents with  . Fever    HPI Wendy Hammond is a 85 y.o. female with history of Alzheimer's dementia presenting to the emergency department today with chief complaint of fever.  Patient was brought in by EMS who reports her temperature was 101.9 at the facility just prior to arrival. Unknown when the fever started.  Son reports pt has a history of UTIs with her last being 3 years ago. They noticed her urine had a strong odor to it today. Son also reports she has had decreased appetite x1 day. Level 5 caveat applies as pt has dementia and is nonverbal.   Past Medical History:  Diagnosis Date  . Alzheimer's dementia ([place])   . Anorexia   . Anxiety   . Depression     Patient Active Problem List   Diagnosis Date Noted  . Fever 04/21/2018  . Alzheimer's dementia ([place]) 04/21/2018  . Hypokalemia 04/21/2018  . Abnormal transaminases 04/21/2018  . Acute lower UTI 04/21/2018  . UTI (urinary tract infection) 04/21/2018  . Altered awareness, transient 07/21/2014  . Dementia with behavioral disturbance ([employer]) 02/10/2014  . Clinical depression 07/04/2011    Past Surgical History:  Procedure Laterality Date  . APPENDECTOMY    . EYE SURGERY       OB History   No obstetric history on file.      Home Medications    Prior to Admission medications   Medication Sig Start Date End Date Taking? Authorizing Provider  CALCIUM PO Take 1 tablet by mouth at bedtime.    [provider]  donepezil (ARICEPT) 10 MG tablet Take 10 mg by mouth at bedtime.  01/12/14   [provider]  memantine (NAMENDA XR) 28 MG CP24 24 hr capsule Take 28 mg by mouth at bedtime.    [provider]  ondansetron (ZOFRAN ODT) 4 MG disintegrating tablet 4mg  ODT q4 hours prn nausea/vomit 01/24/15   Charlesetta Shanks, MD    sertraline (ZOLOFT) 50 MG tablet Take 25 mg by mouth at bedtime.  03/23/14   [provider]  ursodiol (ACTIGALL) 300 MG capsule Take 300 mg by mouth 3 (three) times daily. 01/11/15   [provider]    Family History History reviewed. No pertinent family history.  Social History Social History   Tobacco Use  . Smoking status: Former Smoker    Last attempt to quit: 03/21/1957    Years since quitting: 61.1  . Smokeless tobacco: Never Used  Substance Use Topics  . Alcohol use: No    Alcohol/week: 0.0 standard drinks  . Drug use: No     Allergies   Sulfa antibiotics and Penicillins   Review of Systems Review of Systems  Unable to perform ROS: Dementia     Physical Exam Updated Vital Signs BP 128/61 (BP Location: Left Arm)   Pulse 82   Temp (!) 102.5 F (39.2 C) (Rectal)   Resp 18   SpO2 93%   Physical Exam Constitutional:      General: She is not in acute distress.    Appearance: She is not ill-appearing.  HENT:     Head: Normocephalic and atraumatic.     Right Ear: Tympanic membrane normal.     Left Ear: Tympanic membrane normal.     Nose: Nose normal.     Mouth/Throat:  Mouth: Mucous membranes are dry.     Pharynx: Oropharynx is clear.  Eyes:     General: No scleral icterus.    Conjunctiva/sclera: Conjunctivae normal.     Comments: EOM grossly intact  Neck:     Musculoskeletal: Normal range of motion. No muscular tenderness.  Cardiovascular:     Rate and Rhythm: Normal rate and regular rhythm.     Pulses: Normal pulses.     Heart sounds: Normal heart sounds.  Pulmonary:     Effort: Pulmonary effort is normal.     Breath sounds: Normal breath sounds.  Abdominal:     Tenderness: There is abdominal tenderness in the right upper quadrant. There is no guarding or rebound.  Skin:    General: Skin is warm and dry.  Neurological:     Mental Status: Mental status is at baseline.     Comments: Speech is unclear. Pt opens her eyes when  asked but does not follow any other simple commands. Spontaneous movement in bilateral upper extremities. Face is symmetrical, no facial droop.      ED Treatments / Results  Labs (all labs ordered are listed, but only abnormal results are displayed) Labs Reviewed  COMPREHENSIVE METABOLIC PANEL - Abnormal; Notable for the following components:      Result Value   Potassium 3.2 (*)    Glucose, Bld 119 (*)    BUN 25 (*)    AST 293 (*)    ALT 790 (*)    Alkaline Phosphatase 156 (*)    Total Bilirubin 2.9 (*)    All other components within normal limits  CBC WITH DIFFERENTIAL/PLATELET - Abnormal; Notable for the following components:   Platelets 133 (*)    Lymphs Abs 0.4 (*)    All other components within normal limits  URINALYSIS, ROUTINE W REFLEX MICROSCOPIC - Abnormal; Notable for the following components:   Color, Urine AMBER (*)    APPearance CLOUDY (*)    Protein, ur 100 (*)    Leukocytes, UA MODERATE (*)    WBC, UA >50 (*)    Bacteria, UA RARE (*)    Non Squamous Epithelial 0-5 (*)    All other components within normal limits  CULTURE, BLOOD (ROUTINE X 2)  CULTURE, BLOOD (ROUTINE X 2)  URINE CULTURE  MRSA PCR SCREENING  LACTIC ACID, PLASMA  PROTIME-INR  VITAMIN B12  AMMONIA  HEPATITIS PANEL, ACUTE  VITAMIN D 25 HYDROXY (VIT D DEFICIENCY, FRACTURES)  MAGNESIUM    EKG EKG Interpretation  Date/Time:  Saturday April 21 2018 00:20:25 EST Ventricular Rate:  97 PR Interval:    QRS Duration: 83 QT Interval:  375 QTC Calculation: 477 R Axis:   30 Text Interpretation:  Sinus rhythm Borderline low voltage, extremity leads Artifact NO STEMI Confirmed by Addison Lank [phone]) on 04/21/2018 12:30:09 AM   Radiology Dg Chest 2 View  Result Date: 04/21/2018 CLINICAL DATA:  Fever. Patient is nonverbal. EXAM: CHEST - 2 VIEW COMPARISON:  03/30/2016 FINDINGS: Shallow inspiration with linear atelectasis in the lung bases. Heart size and pulmonary vascularity are normal for  technique. No consolidation or airspace disease in the lungs. No blunting of costophrenic angles. No pneumothorax. Mediastinal contours appear intact. Calcification of the aorta. IMPRESSION: Shallow inspiration with linear atelectasis in the lung bases. Electronically Signed   By: Lucienne Capers M.D.   On: 04/21/2018 00:53   Ct Abdomen Pelvis W Contrast  Result Date: 04/21/2018 CLINICAL DATA:  85 y/o  F; abdominal pain and fever.  EXAM: CT ABDOMEN AND PELVIS WITH CONTRAST TECHNIQUE: Multidetector CT imaging of the abdomen and pelvis was performed using the standard protocol following bolus administration of intravenous contrast. CONTRAST:  130mL ISOVUE-300 IOPAMIDOL (ISOVUE-300) INJECTION 61% COMPARISON:  12/16/2014 CT abdomen and pelvis. FINDINGS: Lower chest: Mild dependent atelectasis of the lungs. Aortic valvular and coronary artery calcific atherosclerosis. Small hiatal hernia. Hepatobiliary: Stable subcentimeter cyst near the liver vascular pedicle. Cholelithiasis. No biliary ductal dilatation. Pancreas: Unremarkable. No pancreatic ductal dilatation or surrounding inflammatory changes. Spleen: Normal in size without focal abnormality. Adrenals/Urinary Tract: Normal adrenal glands. Multiple renal cysts measuring up to 17 mm rest and lower pole of right kidney. No urinary stone disease or hydronephrosis. Bladder wall thickening pericystic edema. Stomach/Bowel: Stomach is within normal limits. Large volume of stool in the rectum may represent constipation. No evidence of bowel wall thickening, distention, or inflammatory changes. Vascular/Lymphatic: Aortic atherosclerosis. No enlarged abdominal or pelvic lymph nodes. Reproductive: Uterus and bilateral adnexa are unremarkable. Other: No abdominal wall hernia or abnormality. No abdominopelvic ascites. Musculoskeletal: No fracture is seen. IMPRESSION: 1. Bladder wall thickening and pericystic edema, likely cystitis. 2. Large volume of stool in the rectum may  represent constipation. 3. Aortic valvular, aortic, and coronary artery calcific atherosclerosis. 4. Small hiatal hernia. 5. Cholelithiasis. Electronically Signed   By: Kristine Garbe M.D.   On: 04/21/2018 04:44   US Abdomen Limited  Result Date: 04/21/2018 CLINICAL DATA:  Right upper quadrant pain EXAM: ULTRASOUND ABDOMEN LIMITED RIGHT UPPER QUADRANT COMPARISON:  None. FINDINGS: Gallbladder: Low-level internal echoes within the gallbladder may represent biliary sludge. No sonographic Percell Miller sign was elicited. No wall thickening of the gallbladder nor pericholecystic fluid is identified. Common bile duct: Diameter: 6.7 mm and within normal limits for age. Liver: No focal lesion identified. Within normal limits in parenchymal echogenicity. Portal vein is patent on color Doppler imaging with normal direction of blood flow towards the liver. IMPRESSION: Small amount of biliary sludge is suggested within the gallbladder without secondary signs of acute cholecystitis. Electronically Signed   By: Ashley Royalty M.D.   On: 04/21/2018 03:11    Procedures Procedures (including critical care time)  Medications Ordered in ED Medications  sodium chloride flush (NS) 0.9 % injection 3 mL (0 mLs Intravenous Hold 04/21/18 0959)  potassium chloride 10 mEq in 100 mL IVPB (has no administration in time range)  metroNIDAZOLE (FLAGYL) IVPB 500 mg (500 mg Intravenous New Bag/Given 04/21/18 0828)  0.9 % NaCl with KCl 20 mEq/ L  infusion (has no administration in time range)  ceFEPIme (MAXIPIME) 1 g in sodium chloride 0.9 % 100 mL IVPB (has no administration in time range)  antiseptic oral rinse (BIOTENE) solution 15 mL (has no administration in time range)  sodium chloride 0.9 % bolus 500 mL (has no administration in time range)  bisacodyl (DULCOLAX) suppository 10 mg (has no administration in time range)  ibuprofen (ADVIL,MOTRIN) tablet 400 mg (400 mg Oral Given 04/21/18 2993)    Or  ketorolac (TORADOL) 15 MG/ML  injection 15 mg ( Intravenous See Alternative 04/21/18 0822)  ceFEPIme (MAXIPIME) 2 g in sodium chloride 0.9 % 100 mL IVPB (0 g Intravenous Stopped 04/21/18 0340)  vancomycin (VANCOCIN) IVPB 1000 mg/200 mL premix (0 mg Intravenous Stopped 04/21/18 0340)  iopamidol (ISOVUE-300) 61 % injection 100 mL (100 mLs Intravenous Contrast Given 04/21/18 0410)  sodium chloride 0.9 % bolus 500 mL (500 mLs Intravenous New Bag/Given 04/21/18 7169)     Initial Impression / Assessment and Plan / ED  Course  I have reviewed the triage vital signs and the nursing notes.  Pertinent labs & imaging results that were available during my care of the patient were reviewed by me and considered in my medical decision making (see chart for details).  Clinical Course as of Apr 22 1003  Sat Apr 21, 2018  0051 Pt with fever of unknown source. Pt with tachycardia and tachypnea. Code sepsis initiated and pt empirically treated. Waiting on lactic acid to result   [KA]  0135 Lactic acid resulted, it is 1.3. Pt does not need weight based fluids at this time   [KA]  0145 DDX includes cholecystitis, cholangitis, biliary colic. LFTs are elevated, abdominal US ordered. UA is suggestive of UTI with  WBC >100 and moderate leukocytes. Urine culture sent. Looking in the EMR pt had UTIx2 years ago and culture shows 80k colonies with e coli sensitive to all medications. Pt already started on empiric antibiotics for sepsis which will also cover her UTI. Appropriate to deescalate next antibiotics for UTI. Chst xray shows shallow inspiration with linear atelectasis in the lung bases.   [KA]  0320 Abdominal US shows small amount of biliary sludge is suggested within the gallbladder without secondary signs of acute cholecystitis. To further evaluate abdominal pain and cause of increase LFTs will order CT abdomen     [KA]  0321 CBC is unremarkable, no leukocytosis   [KA]  0613 CT abdomen shows cholelithiasis, bladder wall thickening and pericystic  edema likely cystitis, large volume stool in rectum, possible constipation   [KA]  0626 Pt admitted to hospitalist for UTI and elevated LFTs with cholelithiasis.    [KA]    Clinical Course User Index [KA] Semiyah Newgent, Harley Hallmark, PA-C   The patient was discussed with and seen by Dr. Leonette Monarch who agrees with the treatment plan.   Final Clinical Impressions(s) / ED Diagnoses   Final diagnoses:  Fever, unspecified fever cause  Urinary tract infection without hematuria, site unspecified  Abdominal pain, unspecified abdominal location    ED Discharge Orders    None       Cherre Robins, PA-C 04/21/18 1005    Flint Melter 04/21/18 1007    Fatima Blank, MD 04/22/18 [phone]

## 2018-04-21 NOTE — ED Notes (Signed)
Pt placed on purewick 

## 2018-04-21 NOTE — Progress Notes (Addendum)
Pharmacy Antibiotic Note  Wendy Hammond is a 85 y.o. adult admitted on 04/20/2018 with FUO.  Pharmacy has been consulted for cefepime dosing. Chart review shows Hx pansensitive E coli in urine in 2018, and enterococcus in urine < 5 yr ago.  Plan:  Cefepime 1g IV q12 hr  Pharmacy will sign off, following peripherally for renal adjustments or culture results  Height: 5' (152.4 cm) Weight: 157 lb (71.2 kg) IBW/kg (Calculated) : 45.5  Temp (24hrs), Avg:102.5 F (39.2 C), Min:102.5 F (39.2 C), Max:102.5 F (39.2 C)  Recent Labs  Lab 04/21/18 0029  WBC 7.4  CREATININE 0.54  LATICACIDVEN 1.3    Estimated Creatinine Clearance (by C-G formula based on SCr of 0.54 mg/dL) Female: 45.3 mL/min Female: 55.9 mL/min    Allergies  Allergen Reactions  . Sulfa Antibiotics Anaphylaxis  . Penicillins      Thank you for allowing pharmacy to be a part of this patient's care.  Reuel Boom, PharmD, BCPS [phone] 04/21/2018, 7:37 AM

## 2018-04-21 NOTE — H&P (View-Only) (Signed)
Wendy Hammond  Referring Provider:  [facility] Primary Care Physician:  Lajean Manes, MD  Reason for Consultation:  Elevated liver enzymes  HPI: Wendy Hammond is a 85 y.o. female presenting to hospital for complaints of fevers.  Patient has advanced Alzheimer's dementia, non-verbal, can not voice complaints.  Found to have urinary tract infection (urinalysis and CT) and elevated liver enzymes and bile duct stones (MRCP) and sludge in gallbladder.  Has had prior history of urinary tract infection.  Today's history obtained during direct conversation with patient's daughter Wendy Hammond), granddaughter (who is [place] in [place] area), and husband.   Past Medical History:  Diagnosis Date  . Alzheimer's dementia ([place])   . Anorexia   . Anxiety   . Depression     Past Surgical History:  Procedure Laterality Date  . APPENDECTOMY    . EYE SURGERY      Prior to Admission medications   Medication Sig Start Date End Date Taking? Authorizing Provider  Calcium Carbonate-Vitamin D3 (CALCIUM 600/VITAMIN D) 600-400 MG-UNIT TABS Take 1 tablet by mouth 2 (two) times daily. 09/12/11  Yes [provider]  miconazole (BAZA ANTIFUNGAL) 2 % cream Apply 1 application topically 2 (two) times daily as needed (after diaper change).   Yes [provider]  mirtazapine (REMERON) 15 MG tablet Take 15 mg by mouth at bedtime. 04/11/18  Yes [provider]  sertraline (ZOLOFT) 50 MG tablet Take 25 mg by mouth at bedtime.  03/23/14  Yes [provider]    Current Facility-Administered Medications  Medication Dose Route Frequency Provider Last Rate Last Dose  . 0.9 % NaCl with KCl 20 mEq/ L  infusion   Intravenous Continuous Regalado, Belkys A, MD 100 mL/hr at 04/21/18 1052    . antiseptic oral rinse (BIOTENE) solution 15 mL  15 mL Mouth Rinse PRN Regalado, Belkys A, MD      . bisacodyl (DULCOLAX) suppository 10 mg  10 mg Rectal Daily PRN Regalado, Belkys  A, MD      . ceFEPIme (MAXIPIME) 1 g in sodium chloride 0.9 % 100 mL IVPB  1 g Intravenous Q12H Regalado, Belkys A, MD 200 mL/hr at 04/21/18 1132 1 g at 04/21/18 1132  . donepezil (ARICEPT) tablet 10 mg  10 mg Oral QHS Regalado, Belkys A, MD      . enoxaparin (LOVENOX) injection 40 mg  40 mg Subcutaneous Q24H Regalado, Belkys A, MD      . ibuprofen (ADVIL,MOTRIN) tablet 400 mg  400 mg Oral Q8H PRN Regalado, Belkys A, MD   400 mg at 04/21/18 6767   Or  . ketorolac (TORADOL) 15 MG/ML injection 15 mg  15 mg Intravenous Q8H PRN Regalado, Belkys A, MD      . memantine (NAMENDA XR) 24 hr capsule 28 mg  28 mg Oral QHS Regalado, Belkys A, MD      . metroNIDAZOLE (FLAGYL) IVPB 500 mg  500 mg Intravenous Q8H Regalado, Belkys A, MD 100 mL/hr at 04/21/18 0828 500 mg at 04/21/18 0828  . ondansetron (ZOFRAN) tablet 4 mg  4 mg Oral Q6H PRN Regalado, Belkys A, MD       Or  . ondansetron (ZOFRAN) injection 4 mg  4 mg Intravenous Q6H PRN Regalado, Belkys A, MD      . polyethylene glycol (MIRALAX / GLYCOLAX) packet 17 g  17 g Oral Daily Regalado, Belkys A, MD   17 g at 04/21/18 1140  . senna (SENOKOT) tablet 8.6 mg  1 tablet  Oral BID Regalado, Belkys A, MD   8.6 mg at 04/21/18 1141  . sertraline (ZOLOFT) tablet 25 mg  25 mg Oral QHS Regalado, Belkys A, MD      . sodium chloride flush (NS) 0.9 % injection 3 mL  3 mL Intravenous Once Regalado, Belkys A, MD   Stopped at 04/21/18 3546   Current Outpatient Medications  Medication Sig Dispense Refill  . Calcium Carbonate-Vitamin D3 (CALCIUM 600/VITAMIN D) 600-400 MG-UNIT TABS Take 1 tablet by mouth 2 (two) times daily.    . miconazole (BAZA ANTIFUNGAL) 2 % cream Apply 1 application topically 2 (two) times daily as needed (after diaper change).    . mirtazapine (REMERON) 15 MG tablet Take 15 mg by mouth at bedtime.    . sertraline (ZOLOFT) 50 MG tablet Take 25 mg by mouth at bedtime.   6    Allergies as of 04/20/2018 - Review Complete 03/30/2016  Allergen Reaction  Noted  . Sulfa antibiotics Anaphylaxis 04/20/2018  . Penicillins  08/27/2009    History reviewed. No pertinent family history.  Social History   Socioeconomic History  . Marital status: Married    Spouse name: Not on file  . Number of children: Not on file  . Years of education: Not on file  . Highest education level: Not on file  Occupational History  . Not on file  Social Needs  . Financial resource strain: Not on file  . Food insecurity:    Worry: Not on file    Inability: Not on file  . Transportation needs:    Medical: Not on file    Non-medical: Not on file  Tobacco Use  . Smoking status: Former Smoker    Last attempt to quit: 03/21/1957    Years since quitting: 61.1  . Smokeless tobacco: Never Used  Substance and Sexual Activity  . Alcohol use: No    Alcohol/week: 0.0 standard drinks  . Drug use: No  . Sexual activity: Not Currently  Lifestyle  . Physical activity:    Days per week: Not on file    Minutes per session: Not on file  . Stress: Not on file  Relationships  . Social connections:    Talks on phone: Not on file    Gets together: Not on file    Attends religious service: Not on file    Active member of club or organization: Not on file    Attends meetings of clubs or organizations: Not on file    Relationship status: Not on file  . Intimate partner violence:    Fear of current or ex partner: Not on file    Emotionally abused: Not on file    Physically abused: Not on file    Forced sexual activity: Not on file  Other Topics Concern  . Not on file  Social History Narrative  . Not on file    Review of Systems: Unable to obtain due to baseline dementia  Physical Exam: Vital signs in last 24 hours: Temp:  [100.7 F (38.2 C)-102.5 F (39.2 C)] 100.7 F (38.2 C) (02/01 1101) Pulse Rate:  [64-96] 64 (02/01 1203) Resp:  [13-28] 13 (02/01 1203) BP: (96-135)/(42-102) 96/42 (02/01 1203) SpO2:  [89 %-99 %] 99 % (02/01 1203) Weight:  [71.2 kg]  71.2 kg (02/01 0201)   General:   Awake, non-verbal, does not appear in acute distress Head:  Normocephalic and atraumatic. Eyes:  Sclera faint icterus.   Conjunctiva pink. Ears:  Normal auditory acuity. Nose:  No deformity, discharge,  or lesions. Mouth:  No deformity or lesions.  Oropharynx dry Neck:  Supple; no masses or thyromegaly. Lungs:  Clear throughout to auscultation.   No wheezes, crackles, or rhonchi. No acute distress. Heart:  Regular rate and rhythm; no murmurs, clicks, rubs,  or gallops. Abdomen:  Soft, nontender and nondistended. No masses, hepatosplenomegaly or hernias noted. Normal bowel sounds, without guarding, and without rebound.    Msk:  Symmetrical without gross deformities. Normal posture. Pulses:  Normal pulses noted. Extremities:  Without clubbing or edema. Neurologic:  Confused (baseline, dementia); grossly normal neurologically. Skin:  Intact without significant lesions or rashes. Cervical Nodes:  No significant cervical adenopathy. Psych:  Alert and cooperative. Normal mood and affect.   Lab Results: Recent Labs    04/21/18 0029  WBC 7.4  HGB 12.6  HCT 40.3  PLT 133*   BMET Recent Labs    04/21/18 0029  NA 142  K 3.2*  CL 107  CO2 25  GLUCOSE 119*  BUN 25*  CREATININE 0.54  CALCIUM 9.0   LFT Recent Labs    04/21/18 0029  PROT 7.0  ALBUMIN 3.6  AST 293*  ALT 790*  ALKPHOS 156*  BILITOT 2.9*   PT/INR Recent Labs    04/21/18 0029  LABPROT 14.2  INR 1.10    Studies/Results: Dg Chest 2 View  Result Date: 04/21/2018 CLINICAL DATA:  Fever. Patient is nonverbal. EXAM: CHEST - 2 VIEW COMPARISON:  03/30/2016 FINDINGS: Shallow inspiration with linear atelectasis in the lung bases. Heart size and pulmonary vascularity are normal for technique. No consolidation or airspace disease in the lungs. No blunting of costophrenic angles. No pneumothorax. Mediastinal contours appear intact. Calcification of the aorta. IMPRESSION: Shallow  inspiration with linear atelectasis in the lung bases. Electronically Signed   By: Lucienne Capers M.D.   On: 04/21/2018 00:53   Ct Abdomen Pelvis W Contrast  Result Date: 04/21/2018 CLINICAL DATA:  85 y/o  F; abdominal pain and fever. EXAM: CT ABDOMEN AND PELVIS WITH CONTRAST TECHNIQUE: Multidetector CT imaging of the abdomen and pelvis was performed using the standard protocol following bolus administration of intravenous contrast. CONTRAST:  157mL ISOVUE-300 IOPAMIDOL (ISOVUE-300) INJECTION 61% COMPARISON:  12/16/2014 CT abdomen and pelvis. FINDINGS: Lower chest: Mild dependent atelectasis of the lungs. Aortic valvular and coronary artery calcific atherosclerosis. Small hiatal hernia. Hepatobiliary: Stable subcentimeter cyst near the liver vascular pedicle. Cholelithiasis. No biliary ductal dilatation. Pancreas: Unremarkable. No pancreatic ductal dilatation or surrounding inflammatory changes. Spleen: Normal in size without focal abnormality. Adrenals/Urinary Tract: Normal adrenal glands. Multiple renal cysts measuring up to 17 mm rest and lower pole of right kidney. No urinary stone disease or hydronephrosis. Bladder wall thickening pericystic edema. Stomach/Bowel: Stomach is within normal limits. Large volume of stool in the rectum may represent constipation. No evidence of bowel wall thickening, distention, or inflammatory changes. Vascular/Lymphatic: Aortic atherosclerosis. No enlarged abdominal or pelvic lymph nodes. Reproductive: Uterus and bilateral adnexa are unremarkable. Other: No abdominal wall hernia or abnormality. No abdominopelvic ascites. Musculoskeletal: No fracture is seen. IMPRESSION: 1. Bladder wall thickening and pericystic edema, likely cystitis. 2. Large volume of stool in the rectum may represent constipation. 3. Aortic valvular, aortic, and coronary artery calcific atherosclerosis. 4. Small hiatal hernia. 5. Cholelithiasis. Electronically Signed   By: Kristine Garbe M.D.    On: 04/21/2018 04:44   Mr Abdomen Mrcp Wo Contrast  Result Date: 04/21/2018 CLINICAL DATA:  Abnormal LFTs, cholelithiasis EXAM: MRI ABDOMEN WITHOUT CONTRAST  (INCLUDING MRCP)  TECHNIQUE: Multiplanar multisequence MR imaging of the abdomen was performed. Heavily T2-weighted images of the biliary and pancreatic ducts were obtained, and three-dimensional MRCP images were rendered by post processing. COMPARISON:  CT abdomen/pelvis dated 04/21/2018 FINDINGS: Motion degraded images. Lower chest: Small bilateral pleural effusions with associated mild dependent atelectasis. Hepatobiliary: Liver is unremarkable.  No hepatic steatosis. Layering gallstones (series 3/image 32), without associated inflammatory changes. No intrahepatic ductal dilatation. Dilated common duct, measuring 12 mm (series 5/image 38). At least two distal CBD stones measuring up to 5 mm (series 5/image 38). Pancreas: Within normal limits. No peripancreatic fluid/inflammatory changes. Spleen:  Within normal limits. Adrenals/Urinary Tract:  Adrenal glands are within normal limits. Small bilateral renal cysts, measuring up to 1.8 cm in the anterior right lower kidney (series 3/image 40). No hydronephrosis. Stomach/Bowel: Stomach is notable for a small hiatal hernia. Visualized bowel is grossly unremarkable. Vascular/Lymphatic:  No evidence of abdominal aortic aneurysm. No suspicious abdominal lymphadenopathy. Other:  No abdominal ascites. Musculoskeletal: No focal osseous lesions. IMPRESSION: Suspected choledocholithiasis, with two distal CBD stones measuring up to 5 mm. Common duct measures 12 mm. No intrahepatic ductal dilatation. ERCP is suggested. Cholelithiasis, without associated inflammatory changes. Additional ancillary findings as above. Electronically Signed   By: Julian Hy M.D.   On: 04/21/2018 10:13   Mr 3d Recon At Scanner  Result Date: 04/21/2018 CLINICAL DATA:  Abnormal LFTs, cholelithiasis EXAM: MRI ABDOMEN WITHOUT CONTRAST   (INCLUDING MRCP) TECHNIQUE: Multiplanar multisequence MR imaging of the abdomen was performed. Heavily T2-weighted images of the biliary and pancreatic ducts were obtained, and three-dimensional MRCP images were rendered by post processing. COMPARISON:  CT abdomen/pelvis dated 04/21/2018 FINDINGS: Motion degraded images. Lower chest: Small bilateral pleural effusions with associated mild dependent atelectasis. Hepatobiliary: Liver is unremarkable.  No hepatic steatosis. Layering gallstones (series 3/image 32), without associated inflammatory changes. No intrahepatic ductal dilatation. Dilated common duct, measuring 12 mm (series 5/image 38). At least two distal CBD stones measuring up to 5 mm (series 5/image 38). Pancreas: Within normal limits. No peripancreatic fluid/inflammatory changes. Spleen:  Within normal limits. Adrenals/Urinary Tract:  Adrenal glands are within normal limits. Small bilateral renal cysts, measuring up to 1.8 cm in the anterior right lower kidney (series 3/image 40). No hydronephrosis. Stomach/Bowel: Stomach is notable for a small hiatal hernia. Visualized bowel is grossly unremarkable. Vascular/Lymphatic:  No evidence of abdominal aortic aneurysm. No suspicious abdominal lymphadenopathy. Other:  No abdominal ascites. Musculoskeletal: No focal osseous lesions. IMPRESSION: Suspected choledocholithiasis, with two distal CBD stones measuring up to 5 mm. Common duct measures 12 mm. No intrahepatic ductal dilatation. ERCP is suggested. Cholelithiasis, without associated inflammatory changes. Additional ancillary findings as above. Electronically Signed   By: Julian Hy M.D.   On: 04/21/2018 10:13   US Abdomen Limited  Result Date: 04/21/2018 CLINICAL DATA:  Right upper quadrant pain EXAM: ULTRASOUND ABDOMEN LIMITED RIGHT UPPER QUADRANT COMPARISON:  None. FINDINGS: Gallbladder: Low-level internal echoes within the gallbladder may represent biliary sludge. No sonographic Percell Miller sign was  elicited. No wall thickening of the gallbladder nor pericholecystic fluid is identified. Common bile duct: Diameter: 6.7 mm and within normal limits for age. Liver: No focal lesion identified. Within normal limits in parenchymal echogenicity. Portal vein is patent on color Doppler imaging with normal direction of blood flow towards the liver. IMPRESSION: Small amount of biliary sludge is suggested within the gallbladder without secondary signs of acute cholecystitis. Electronically Signed   By: Ashley Royalty M.D.   On: 04/21/2018 03:11   Impression:  1.  Fevers.  Concern for cholangitis. 2.  Elevated liver enzymes, mixed pattern. 3.  Choledocholithiasis suspected on MRCP. 4.  Urinary tract infection, seeding from biliary tract infection. 5.  Advanced Alzheimer's.  Plan:  1.  Volume resuscitation. 2.  Antibiotics. 3.  Clear liquids today, NPO after midnight. 4.  ERCP planned for tomorrow morning. 5.  Risks (up to and including bleeding, infection, perforation, pancreatitis that can be complicated by infected necrosis and death), benefits (removal of stones, alleviating blockage, decreasing risk of cholangitis or choledocholithiasis-related pancreatitis), and alternatives (watchful waiting, percutaneous transhepatic cholangiography) of ERCP were explained to patient/family in detail and patient elects to proceed.  Daughter, Wendy Hammond, will need to provide informed consent given patient's advanced dementia. 6.  [facility] will follow.   LOS: 0 days   Wendy Hammond M  04/21/2018, 12:39 PM  Cell [phone] If no answer or after 5 PM call [phone]

## 2018-04-21 NOTE — H&P (Addendum)
History and Physical  Wendy Hammond KPT:[id number] DOB: Sep 06, 1930 DOA: 04/20/2018  PCP: Lajean Manes, MD Patient coming from: SNF  I have personally briefly reviewed patient's old medical records in [place]   Chief Complaint: Fever.   HPI: Wendy Hammond is a 85 y.o. adult with past medical history significant for Alzheimer's dementia, depression who presents from a skilled nursing facility with fever temperature at 101.  Per family patient has a prior history of UTI last episode 3 years ago.  Family noticed that patient urine had a strong odor the day of admission.  Patient has also had decreased appetite.  History obtained from records. Patient at baseline is nonverbal due to her history of dementia, confirmed information with son.    Evaluation in the ED; sodium 142, potassium 3.2, BUN 25, creatinine 0.5, alkaline phosphatase 156, AST 293, ALT 790, bilirubin 2.9, lactic acid 1.3, white blood cell 7.4, hemoglobin 12.6, platelets 133.  UA with more than 50 white blood cell, chest x-ray: Shallow inspiration with linear  Atelectasis.   -CT abdomen: Bladder wall thickening and pericystic edema, likely cystitis. Large volume of stool in the rectum may represent constipation. Aortic valvular, aortic, and coronary artery calcific atherosclerosis. Small hiatal hernia. Cholelithiasis. -Abdominal US; Small amount of biliary sludge is suggested within the gallbladder without secondary signs of acute cholecystitis.   Review of Systems: All systems reviewed and apart from history of presenting illness, are negative.  Past Medical History:  Diagnosis Date  . Alzheimer's dementia ([place])   . Anorexia   . Anxiety   . Depression    Past Surgical History:  Procedure Laterality Date  . APPENDECTOMY    . EYE SURGERY     Social History:  reports that he quit smoking about 61 years ago. He has never used smokeless tobacco. He reports that he does not drink alcohol or use  drugs.   Allergies  Allergen Reactions  . Sulfa Antibiotics Anaphylaxis  . Penicillins     Family History; obtained from son.  Patient parents did not had any medical problems.  Her father died of an MI at age of 43 her mother died at age of 12 also.  Prior to Admission medications   Medication Sig Start Date End Date Taking? Authorizing Provider  CALCIUM PO Take 1 tablet by mouth at bedtime.    [provider]  cephALEXin (KEFLEX) 500 MG capsule Take 1 capsule (500 mg total) by mouth 3 (three) times daily. 03/30/16   Leo Grosser, MD  donepezil (ARICEPT) 10 MG tablet Take 10 mg by mouth at bedtime.  01/12/14   [provider]  memantine (NAMENDA XR) 28 MG CP24 24 hr capsule Take 28 mg by mouth at bedtime.    [provider]  ondansetron (ZOFRAN ODT) 4 MG disintegrating tablet 4mg  ODT q4 hours prn nausea/vomit 01/24/15   Charlesetta Shanks, MD  oxyCODONE-acetaminophen (PERCOCET/ROXICET) 5-325 MG tablet Take 2 tablets by mouth every 4 (four) hours as needed for severe pain. Patient not taking: Reported on 01/24/2015 12/16/14   Dowless, Aldona Bar Tripp, PA-C  sertraline (ZOLOFT) 50 MG tablet Take 25 mg by mouth at bedtime.  03/23/14   [provider]  ursodiol (ACTIGALL) 300 MG capsule Take 300 mg by mouth 3 (three) times daily. 01/11/15   [provider]   Physical Exam: Vitals:   04/21/18 0600 04/21/18 0630 04/21/18 0700 04/21/18 0730  BP: (!) 117/50 (!) 116/52 (!) 113/49 (!) 131/52  Pulse: 79 81 80  77  Resp: 17 15 16 18   Temp:      TempSrc:      SpO2: 91% (!) 89% 91% 90%  Weight:      Height:         General exam: Obese, in no distress, nonverbal valve.  Head, eyes and ENT: Nontraumatic and normocephalic. Pupils equally reacting to light and accommodation. Oral mucosa dry, poor oral care.  Neck: Supple. No JVD, carotid bruit or thyromegaly.  Lymphatics: No lymphadenopathy.  Respiratory system: Clear to auscultation. No increased work  of breathing.  Cardiovascular system: S1 and S2 heard, RRR. No JVD, murmurs, gallops, clicks or pedal edema.  Gastrointestinal system: Abdomen is nondistended, soft and nontender right upper quadrant. Normal bowel sounds heard. No organomegaly or masses appreciated.  Central nervous system: Alert, not following commands, at baseline nonverbal  Extremities: Peripheral pulses symmetrically felt.   Skin: No rashes or acute findings.  Musculoskeletal system: Negative exam.  Psychiatry: Unable to obtain   Labs on Admission:  Basic Metabolic Panel: Recent Labs  Lab 04/21/18 0029  NA 142  K 3.2*  CL 107  CO2 25  GLUCOSE 119*  BUN 25*  CREATININE 0.54  CALCIUM 9.0   Liver Function Tests: Recent Labs  Lab 04/21/18 0029  AST 293*  ALT 790*  ALKPHOS 156*  BILITOT 2.9*  PROT 7.0  ALBUMIN 3.6   No results for input(s): LIPASE, AMYLASE in the last 168 hours. No results for input(s): AMMONIA in the last 168 hours. CBC: Recent Labs  Lab 04/21/18 0029  WBC 7.4  NEUTROABS 6.4  HGB 12.6  HCT 40.3  MCV 96.2  PLT 133*   Cardiac Enzymes: No results for input(s): CKTOTAL, CKMB, CKMBINDEX, TROPONINI in the last 168 hours.  BNP (last 3 results) No results for input(s): PROBNP in the last 8760 hours. CBG: No results for input(s): GLUCAP in the last 168 hours.  Radiological Exams on Admission: Dg Chest 2 View  Result Date: 04/21/2018 CLINICAL DATA:  Fever. Patient is nonverbal. EXAM: CHEST - 2 VIEW COMPARISON:  03/30/2016 FINDINGS: Shallow inspiration with linear atelectasis in the lung bases. Heart size and pulmonary vascularity are normal for technique. No consolidation or airspace disease in the lungs. No blunting of costophrenic angles. No pneumothorax. Mediastinal contours appear intact. Calcification of the aorta. IMPRESSION: Shallow inspiration with linear atelectasis in the lung bases. Electronically Signed   By: Lucienne Capers M.D.   On: 04/21/2018 00:53   Ct  Abdomen Pelvis W Contrast  Result Date: 04/21/2018 CLINICAL DATA:  85 y/o  F; abdominal pain and fever. EXAM: CT ABDOMEN AND PELVIS WITH CONTRAST TECHNIQUE: Multidetector CT imaging of the abdomen and pelvis was performed using the standard protocol following bolus administration of intravenous contrast. CONTRAST:  176mL ISOVUE-300 IOPAMIDOL (ISOVUE-300) INJECTION 61% COMPARISON:  12/16/2014 CT abdomen and pelvis. FINDINGS: Lower chest: Mild dependent atelectasis of the lungs. Aortic valvular and coronary artery calcific atherosclerosis. Small hiatal hernia. Hepatobiliary: Stable subcentimeter cyst near the liver vascular pedicle. Cholelithiasis. No biliary ductal dilatation. Pancreas: Unremarkable. No pancreatic ductal dilatation or surrounding inflammatory changes. Spleen: Normal in size without focal abnormality. Adrenals/Urinary Tract: Normal adrenal glands. Multiple renal cysts measuring up to 17 mm rest and lower pole of right kidney. No urinary stone disease or hydronephrosis. Bladder wall thickening pericystic edema. Stomach/Bowel: Stomach is within normal limits. Large volume of stool in the rectum may represent constipation. No evidence of bowel wall thickening, distention, or inflammatory changes. Vascular/Lymphatic: Aortic atherosclerosis. No enlarged abdominal  or pelvic lymph nodes. Reproductive: Uterus and bilateral adnexa are unremarkable. Other: No abdominal wall hernia or abnormality. No abdominopelvic ascites. Musculoskeletal: No fracture is seen. IMPRESSION: 1. Bladder wall thickening and pericystic edema, likely cystitis. 2. Large volume of stool in the rectum may represent constipation. 3. Aortic valvular, aortic, and coronary artery calcific atherosclerosis. 4. Small hiatal hernia. 5. Cholelithiasis. Electronically Signed   By: Kristine Garbe M.D.   On: 04/21/2018 04:44   US Abdomen Limited  Result Date: 04/21/2018 CLINICAL DATA:  Right upper quadrant pain EXAM: ULTRASOUND ABDOMEN  LIMITED RIGHT UPPER QUADRANT COMPARISON:  None. FINDINGS: Gallbladder: Low-level internal echoes within the gallbladder may represent biliary sludge. No sonographic Percell Miller sign was elicited. No wall thickening of the gallbladder nor pericholecystic fluid is identified. Common bile duct: Diameter: 6.7 mm and within normal limits for age. Liver: No focal lesion identified. Within normal limits in parenchymal echogenicity. Portal vein is patent on color Doppler imaging with normal direction of blood flow towards the liver. IMPRESSION: Small amount of biliary sludge is suggested within the gallbladder without secondary signs of acute cholecystitis. Electronically Signed   By: Ashley Royalty M.D.   On: 04/21/2018 03:11    EKG: sinus rhythm.   Assessment/Plan Principal Problem:   Abnormal transaminases Active Problems:   Dementia with behavioral disturbance (HCC)   Fever   Alzheimer's dementia (HCC)   Hypokalemia   Acute lower UTI   UTI (urinary tract infection)  1-UTI; patient presents with fever, UA with more than 50 white blood cell. Follow urine culture, blood cultures. Continue with cefepime, to cover for UTI and intra-abdominal infection.   2-Transaminases; in a patient with fever.  Ultrasound was negative for cholecystitis.  Biliary duct does not appear to be dilated. -I have discussed the case with Dr. Hilarie Fredrickson with GI.  Will proceed with MRCP, and viral hepatitis panel. If MRCP is positive will need to formally consult GI. -We will cover with IV antibiotics, cefepime and Flagyl to cover for cholangitis, cholecystitis. -she was not taking ursodiol.  Addendum;  MRCP; showed choledocholithisis. GI informed of results. GI will see patient  in consultation.    3-Constipation; Dulcolax suppository ordered.  MiraLAX and Senokot ordered.Marland Kitchen  4-Dementia; nonverbal at baseline.  Treated urinary tract infection. Will also check TSH, vitamin D, B12 level. Continue with Zoloft and Remeron.  Mouth  care..\  5-Hypokalemia; replete IV.  Check mg level.     DVT Prophylaxis: Lovenox Code Status: DNR, discussed with son. Family Communication: Son over the phone. Disposition Plan: Admit to the hospital for treatment of UTI, possible cholangitis.  Time spent: 75 minutes.     It is my clinical opinion that admission to INPATIENT is reasonable and necessary in this 85 y.o. adult . presenting with symptoms of urinary tract infection, intra-abdominal infection concern for cholangitis, concerning for infection. . in the context of PMH including: Dementia, recurrent UTI.  . with pertinent positives on physical exam including: Fevers, tachypnea, systolic blood pressure soft in the 116. . and pertinent positives on radiographic and laboratory data including: CT with gallbladder with gallstone, CT with evidence of cystitis, constipation.  UA with more than 50 white blood cell.  . Workup and treatment include CT abdomen, ultrasound, UA.  Patient will receive IV fluids, IV antibiotics, treatment for the fever.  Given the aforementioned, the predictability of an adverse outcome is felt to be significant. I expect that the patient will require at least 2 midnights in the hospital to treat this condition.  Elmarie Shiley MD [facility]   04/21/2018, 7:59 AM

## 2018-04-21 NOTE — Progress Notes (Signed)
PHARMACY - PHYSICIAN COMMUNICATION CRITICAL VALUE ALERT - BLOOD CULTURE IDENTIFICATION (BCID)  Wendy Hammond is an 85 y.o. female who presented to [facility] on 04/20/2018 with a chief complaint of UTI and cholangitis.  Assessment:  ERCP scheduled for 2/2  Name of physician (or Provider) Contacted: Dr Tyrell Antonio  Current antibiotics: Cefepime 1gm IV q12h, Flagyl 500mg  IV q8h  Changes to prescribed antibiotics recommended:  Patient is on recommended antibiotics - No changes needed  Results for orders placed or performed during the hospital encounter of 04/20/18  Blood Culture ID Panel (Reflexed) (Collected: 04/20/2018 11:47 PM)  Result Value Ref Range   Enterococcus species NOT DETECTED NOT DETECTED   Listeria monocytogenes NOT DETECTED NOT DETECTED   Staphylococcus species NOT DETECTED NOT DETECTED   Staphylococcus aureus (BCID) NOT DETECTED NOT DETECTED   Streptococcus species NOT DETECTED NOT DETECTED   Streptococcus agalactiae NOT DETECTED NOT DETECTED   Streptococcus pneumoniae NOT DETECTED NOT DETECTED   Streptococcus pyogenes NOT DETECTED NOT DETECTED   Acinetobacter baumannii NOT DETECTED NOT DETECTED   Enterobacteriaceae species DETECTED (A) NOT DETECTED   Enterobacter cloacae complex DETECTED (A) NOT DETECTED   Escherichia coli NOT DETECTED NOT DETECTED   Klebsiella oxytoca NOT DETECTED NOT DETECTED   Klebsiella pneumoniae NOT DETECTED NOT DETECTED   Proteus species NOT DETECTED NOT DETECTED   Serratia marcescens NOT DETECTED NOT DETECTED   Carbapenem resistance NOT DETECTED NOT DETECTED   Haemophilus influenzae NOT DETECTED NOT DETECTED   Neisseria meningitidis NOT DETECTED NOT DETECTED   Pseudomonas aeruginosa NOT DETECTED NOT DETECTED   Candida albicans NOT DETECTED NOT DETECTED   Candida glabrata NOT DETECTED NOT DETECTED   Candida krusei NOT DETECTED NOT DETECTED   Candida parapsilosis NOT DETECTED NOT DETECTED   Candida tropicalis NOT DETECTED NOT DETECTED     Everette Rank, PharmD 04/21/2018  6:58 PM

## 2018-04-21 NOTE — Consult Note (Signed)
[facility] Consultation Note  Referring Provider:  [facility] Primary Care Physician:  Lajean Manes, MD  Reason for Consultation:  Elevated liver enzymes  HPI: Wendy Hammond is a 85 y.o. female presenting to hospital for complaints of fevers.  Patient has advanced Alzheimer's dementia, non-verbal, can not voice complaints.  Found to have urinary tract infection (urinalysis and CT) and elevated liver enzymes and bile duct stones (MRCP) and sludge in gallbladder.  Has had prior history of urinary tract infection.  Today's history obtained during direct conversation with patient's daughter Elzie Rings), granddaughter (who is [place] in [place] area), and husband.   Past Medical History:  Diagnosis Date  . Alzheimer's dementia ([place])   . Anorexia   . Anxiety   . Depression     Past Surgical History:  Procedure Laterality Date  . APPENDECTOMY    . EYE SURGERY      Prior to Admission medications   Medication Sig Start Date End Date Taking? Authorizing Provider  Calcium Carbonate-Vitamin D3 (CALCIUM 600/VITAMIN D) 600-400 MG-UNIT TABS Take 1 tablet by mouth 2 (two) times daily. 09/12/11  Yes [provider]  miconazole (BAZA ANTIFUNGAL) 2 % cream Apply 1 application topically 2 (two) times daily as needed (after diaper change).   Yes [provider]  mirtazapine (REMERON) 15 MG tablet Take 15 mg by mouth at bedtime. 04/11/18  Yes [provider]  sertraline (ZOLOFT) 50 MG tablet Take 25 mg by mouth at bedtime.  03/23/14  Yes [provider]    Current Facility-Administered Medications  Medication Dose Route Frequency Provider Last Rate Last Dose  . 0.9 % NaCl with KCl 20 mEq/ L  infusion   Intravenous Continuous Regalado, Belkys A, MD 100 mL/hr at 04/21/18 1052    . antiseptic oral rinse (BIOTENE) solution 15 mL  15 mL Mouth Rinse PRN Regalado, Belkys A, MD      . bisacodyl (DULCOLAX) suppository 10 mg  10 mg Rectal Daily PRN Regalado, Belkys  A, MD      . ceFEPIme (MAXIPIME) 1 g in sodium chloride 0.9 % 100 mL IVPB  1 g Intravenous Q12H Regalado, Belkys A, MD 200 mL/hr at 04/21/18 1132 1 g at 04/21/18 1132  . donepezil (ARICEPT) tablet 10 mg  10 mg Oral QHS Regalado, Belkys A, MD      . enoxaparin (LOVENOX) injection 40 mg  40 mg Subcutaneous Q24H Regalado, Belkys A, MD      . ibuprofen (ADVIL,MOTRIN) tablet 400 mg  400 mg Oral Q8H PRN Regalado, Belkys A, MD   400 mg at 04/21/18 4696   Or  . ketorolac (TORADOL) 15 MG/ML injection 15 mg  15 mg Intravenous Q8H PRN Regalado, Belkys A, MD      . memantine (NAMENDA XR) 24 hr capsule 28 mg  28 mg Oral QHS Regalado, Belkys A, MD      . metroNIDAZOLE (FLAGYL) IVPB 500 mg  500 mg Intravenous Q8H Regalado, Belkys A, MD 100 mL/hr at 04/21/18 0828 500 mg at 04/21/18 0828  . ondansetron (ZOFRAN) tablet 4 mg  4 mg Oral Q6H PRN Regalado, Belkys A, MD       Or  . ondansetron (ZOFRAN) injection 4 mg  4 mg Intravenous Q6H PRN Regalado, Belkys A, MD      . polyethylene glycol (MIRALAX / GLYCOLAX) packet 17 g  17 g Oral Daily Regalado, Belkys A, MD   17 g at 04/21/18 1140  . senna (SENOKOT) tablet 8.6 mg  1 tablet  Oral BID Regalado, Belkys A, MD   8.6 mg at 04/21/18 1141  . sertraline (ZOLOFT) tablet 25 mg  25 mg Oral QHS Regalado, Belkys A, MD      . sodium chloride flush (NS) 0.9 % injection 3 mL  3 mL Intravenous Once Regalado, Belkys A, MD   Stopped at 04/21/18 3267   Current Outpatient Medications  Medication Sig Dispense Refill  . Calcium Carbonate-Vitamin D3 (CALCIUM 600/VITAMIN D) 600-400 MG-UNIT TABS Take 1 tablet by mouth 2 (two) times daily.    . miconazole (BAZA ANTIFUNGAL) 2 % cream Apply 1 application topically 2 (two) times daily as needed (after diaper change).    . mirtazapine (REMERON) 15 MG tablet Take 15 mg by mouth at bedtime.    . sertraline (ZOLOFT) 50 MG tablet Take 25 mg by mouth at bedtime.   6    Allergies as of 04/20/2018 - Review Complete 03/30/2016  Allergen Reaction  Noted  . Sulfa antibiotics Anaphylaxis 04/20/2018  . Penicillins  08/27/2009    History reviewed. No pertinent family history.  Social History   Socioeconomic History  . Marital status: Married    Spouse name: Not on file  . Number of children: Not on file  . Years of education: Not on file  . Highest education level: Not on file  Occupational History  . Not on file  Social Needs  . Financial resource strain: Not on file  . Food insecurity:    Worry: Not on file    Inability: Not on file  . Transportation needs:    Medical: Not on file    Non-medical: Not on file  Tobacco Use  . Smoking status: Former Smoker    Last attempt to quit: 03/21/1957    Years since quitting: 61.1  . Smokeless tobacco: Never Used  Substance and Sexual Activity  . Alcohol use: No    Alcohol/week: 0.0 standard drinks  . Drug use: No  . Sexual activity: Not Currently  Lifestyle  . Physical activity:    Days per week: Not on file    Minutes per session: Not on file  . Stress: Not on file  Relationships  . Social connections:    Talks on phone: Not on file    Gets together: Not on file    Attends religious service: Not on file    Active member of club or organization: Not on file    Attends meetings of clubs or organizations: Not on file    Relationship status: Not on file  . Intimate partner violence:    Fear of current or ex partner: Not on file    Emotionally abused: Not on file    Physically abused: Not on file    Forced sexual activity: Not on file  Other Topics Concern  . Not on file  Social History Narrative  . Not on file    Review of Systems: Unable to obtain due to baseline dementia  Physical Exam: Vital signs in last 24 hours: Temp:  [100.7 F (38.2 C)-102.5 F (39.2 C)] 100.7 F (38.2 C) (02/01 1101) Pulse Rate:  [64-96] 64 (02/01 1203) Resp:  [13-28] 13 (02/01 1203) BP: (96-135)/(42-102) 96/42 (02/01 1203) SpO2:  [89 %-99 %] 99 % (02/01 1203) Weight:  [71.2 kg]  71.2 kg (02/01 0201)   General:   Awake, non-verbal, does not appear in acute distress Head:  Normocephalic and atraumatic. Eyes:  Sclera faint icterus.   Conjunctiva pink. Ears:  Normal auditory acuity. Nose:  No deformity, discharge,  or lesions. Mouth:  No deformity or lesions.  Oropharynx dry Neck:  Supple; no masses or thyromegaly. Lungs:  Clear throughout to auscultation.   No wheezes, crackles, or rhonchi. No acute distress. Heart:  Regular rate and rhythm; no murmurs, clicks, rubs,  or gallops. Abdomen:  Soft, nontender and nondistended. No masses, hepatosplenomegaly or hernias noted. Normal bowel sounds, without guarding, and without rebound.    Msk:  Symmetrical without gross deformities. Normal posture. Pulses:  Normal pulses noted. Extremities:  Without clubbing or edema. Neurologic:  Confused (baseline, dementia); grossly normal neurologically. Skin:  Intact without significant lesions or rashes. Cervical Nodes:  No significant cervical adenopathy. Psych:  Alert and cooperative. Normal mood and affect.   Lab Results: Recent Labs    04/21/18 0029  WBC 7.4  HGB 12.6  HCT 40.3  PLT 133*   BMET Recent Labs    04/21/18 0029  NA 142  K 3.2*  CL 107  CO2 25  GLUCOSE 119*  BUN 25*  CREATININE 0.54  CALCIUM 9.0   LFT Recent Labs    04/21/18 0029  PROT 7.0  ALBUMIN 3.6  AST 293*  ALT 790*  ALKPHOS 156*  BILITOT 2.9*   PT/INR Recent Labs    04/21/18 0029  LABPROT 14.2  INR 1.10    Studies/Results: Dg Chest 2 View  Result Date: 04/21/2018 CLINICAL DATA:  Fever. Patient is nonverbal. EXAM: CHEST - 2 VIEW COMPARISON:  03/30/2016 FINDINGS: Shallow inspiration with linear atelectasis in the lung bases. Heart size and pulmonary vascularity are normal for technique. No consolidation or airspace disease in the lungs. No blunting of costophrenic angles. No pneumothorax. Mediastinal contours appear intact. Calcification of the aorta. IMPRESSION: Shallow  inspiration with linear atelectasis in the lung bases. Electronically Signed   By: Lucienne Capers M.D.   On: 04/21/2018 00:53   Ct Abdomen Pelvis W Contrast  Result Date: 04/21/2018 CLINICAL DATA:  85 y/o  F; abdominal pain and fever. EXAM: CT ABDOMEN AND PELVIS WITH CONTRAST TECHNIQUE: Multidetector CT imaging of the abdomen and pelvis was performed using the standard protocol following bolus administration of intravenous contrast. CONTRAST:  149mL ISOVUE-300 IOPAMIDOL (ISOVUE-300) INJECTION 61% COMPARISON:  12/16/2014 CT abdomen and pelvis. FINDINGS: Lower chest: Mild dependent atelectasis of the lungs. Aortic valvular and coronary artery calcific atherosclerosis. Small hiatal hernia. Hepatobiliary: Stable subcentimeter cyst near the liver vascular pedicle. Cholelithiasis. No biliary ductal dilatation. Pancreas: Unremarkable. No pancreatic ductal dilatation or surrounding inflammatory changes. Spleen: Normal in size without focal abnormality. Adrenals/Urinary Tract: Normal adrenal glands. Multiple renal cysts measuring up to 17 mm rest and lower pole of right kidney. No urinary stone disease or hydronephrosis. Bladder wall thickening pericystic edema. Stomach/Bowel: Stomach is within normal limits. Large volume of stool in the rectum may represent constipation. No evidence of bowel wall thickening, distention, or inflammatory changes. Vascular/Lymphatic: Aortic atherosclerosis. No enlarged abdominal or pelvic lymph nodes. Reproductive: Uterus and bilateral adnexa are unremarkable. Other: No abdominal wall hernia or abnormality. No abdominopelvic ascites. Musculoskeletal: No fracture is seen. IMPRESSION: 1. Bladder wall thickening and pericystic edema, likely cystitis. 2. Large volume of stool in the rectum may represent constipation. 3. Aortic valvular, aortic, and coronary artery calcific atherosclerosis. 4. Small hiatal hernia. 5. Cholelithiasis. Electronically Signed   By: Kristine Garbe M.D.    On: 04/21/2018 04:44   Mr Abdomen Mrcp Wo Contrast  Result Date: 04/21/2018 CLINICAL DATA:  Abnormal LFTs, cholelithiasis EXAM: MRI ABDOMEN WITHOUT CONTRAST  (INCLUDING MRCP)  TECHNIQUE: Multiplanar multisequence MR imaging of the abdomen was performed. Heavily T2-weighted images of the biliary and pancreatic ducts were obtained, and three-dimensional MRCP images were rendered by post processing. COMPARISON:  CT abdomen/pelvis dated 04/21/2018 FINDINGS: Motion degraded images. Lower chest: Small bilateral pleural effusions with associated mild dependent atelectasis. Hepatobiliary: Liver is unremarkable.  No hepatic steatosis. Layering gallstones (series 3/image 32), without associated inflammatory changes. No intrahepatic ductal dilatation. Dilated common duct, measuring 12 mm (series 5/image 38). At least two distal CBD stones measuring up to 5 mm (series 5/image 38). Pancreas: Within normal limits. No peripancreatic fluid/inflammatory changes. Spleen:  Within normal limits. Adrenals/Urinary Tract:  Adrenal glands are within normal limits. Small bilateral renal cysts, measuring up to 1.8 cm in the anterior right lower kidney (series 3/image 40). No hydronephrosis. Stomach/Bowel: Stomach is notable for a small hiatal hernia. Visualized bowel is grossly unremarkable. Vascular/Lymphatic:  No evidence of abdominal aortic aneurysm. No suspicious abdominal lymphadenopathy. Other:  No abdominal ascites. Musculoskeletal: No focal osseous lesions. IMPRESSION: Suspected choledocholithiasis, with two distal CBD stones measuring up to 5 mm. Common duct measures 12 mm. No intrahepatic ductal dilatation. ERCP is suggested. Cholelithiasis, without associated inflammatory changes. Additional ancillary findings as above. Electronically Signed   By: Julian Hy M.D.   On: 04/21/2018 10:13   Mr 3d Recon At Scanner  Result Date: 04/21/2018 CLINICAL DATA:  Abnormal LFTs, cholelithiasis EXAM: MRI ABDOMEN WITHOUT CONTRAST   (INCLUDING MRCP) TECHNIQUE: Multiplanar multisequence MR imaging of the abdomen was performed. Heavily T2-weighted images of the biliary and pancreatic ducts were obtained, and three-dimensional MRCP images were rendered by post processing. COMPARISON:  CT abdomen/pelvis dated 04/21/2018 FINDINGS: Motion degraded images. Lower chest: Small bilateral pleural effusions with associated mild dependent atelectasis. Hepatobiliary: Liver is unremarkable.  No hepatic steatosis. Layering gallstones (series 3/image 32), without associated inflammatory changes. No intrahepatic ductal dilatation. Dilated common duct, measuring 12 mm (series 5/image 38). At least two distal CBD stones measuring up to 5 mm (series 5/image 38). Pancreas: Within normal limits. No peripancreatic fluid/inflammatory changes. Spleen:  Within normal limits. Adrenals/Urinary Tract:  Adrenal glands are within normal limits. Small bilateral renal cysts, measuring up to 1.8 cm in the anterior right lower kidney (series 3/image 40). No hydronephrosis. Stomach/Bowel: Stomach is notable for a small hiatal hernia. Visualized bowel is grossly unremarkable. Vascular/Lymphatic:  No evidence of abdominal aortic aneurysm. No suspicious abdominal lymphadenopathy. Other:  No abdominal ascites. Musculoskeletal: No focal osseous lesions. IMPRESSION: Suspected choledocholithiasis, with two distal CBD stones measuring up to 5 mm. Common duct measures 12 mm. No intrahepatic ductal dilatation. ERCP is suggested. Cholelithiasis, without associated inflammatory changes. Additional ancillary findings as above. Electronically Signed   By: Julian Hy M.D.   On: 04/21/2018 10:13   US Abdomen Limited  Result Date: 04/21/2018 CLINICAL DATA:  Right upper quadrant pain EXAM: ULTRASOUND ABDOMEN LIMITED RIGHT UPPER QUADRANT COMPARISON:  None. FINDINGS: Gallbladder: Low-level internal echoes within the gallbladder may represent biliary sludge. No sonographic Percell Miller sign was  elicited. No wall thickening of the gallbladder nor pericholecystic fluid is identified. Common bile duct: Diameter: 6.7 mm and within normal limits for age. Liver: No focal lesion identified. Within normal limits in parenchymal echogenicity. Portal vein is patent on color Doppler imaging with normal direction of blood flow towards the liver. IMPRESSION: Small amount of biliary sludge is suggested within the gallbladder without secondary signs of acute cholecystitis. Electronically Signed   By: Ashley Royalty M.D.   On: 04/21/2018 03:11   Impression:  1.  Fevers.  Concern for cholangitis. 2.  Elevated liver enzymes, mixed pattern. 3.  Choledocholithiasis suspected on MRCP. 4.  Urinary tract infection, seeding from biliary tract infection. 5.  Advanced Alzheimer's.  Plan:  1.  Volume resuscitation. 2.  Antibiotics. 3.  Clear liquids today, NPO after midnight. 4.  ERCP planned for tomorrow morning. 5.  Risks (up to and including bleeding, infection, perforation, pancreatitis that can be complicated by infected necrosis and death), benefits (removal of stones, alleviating blockage, decreasing risk of cholangitis or choledocholithiasis-related pancreatitis), and alternatives (watchful waiting, percutaneous transhepatic cholangiography) of ERCP were explained to patient/family in detail and patient elects to proceed.  Daughter, Elzie Rings, will need to provide informed consent given patient's advanced dementia. 6.  [facility] will follow.   LOS: 0 days   Glenys Snader M  04/21/2018, 12:39 PM  Cell [phone] If no answer or after 5 PM call [phone]

## 2018-04-21 NOTE — ED Notes (Signed)
Pt had large bowel movement.  Pt cleaned and brief applied. Chucks and gown changed.

## 2018-04-21 NOTE — ED Notes (Signed)
Attempt to call report. recieving RN at lunch

## 2018-04-21 NOTE — Progress Notes (Signed)
A consult was received from an ED physician for Vancomycin, cefepime per pharmacy dosing.  The patient's profile has been reviewed for ht/wt/allergies/indication/available labs.   A one time order has been placed for Vancomycin 1gm iv x1, and Cefepime 2gm iv x1.  Further antibiotics/pharmacy consults should be ordered by admitting physician if indicated.                       Thank you, Nani Skillern Crowford 04/21/2018  12:57 AM

## 2018-04-21 NOTE — Progress Notes (Signed)
Pt has arrived to room 1514 from ED at this time.

## 2018-04-21 NOTE — ED Notes (Signed)
ED TO INPATIENT HANDOFF REPORT  Name/Age/Gender Wendy Hammond 85 y.o. female  Code Status    Code Status Orders  (From admission, onward)         Start     Ordered   04/21/18 1022  Do not attempt resuscitation (DNR)  Continuous    Question Answer Comment  In the event of cardiac or respiratory ARREST Do not call a "code blue"   In the event of cardiac or respiratory ARREST Do not perform Intubation, CPR, defibrillation or ACLS   In the event of cardiac or respiratory ARREST Use medication by any route, position, wound care, and other measures to relive pain and suffering. May use oxygen, suction and manual treatment of airway obstruction as needed for comfort.      04/21/18 1022        Code Status History    This patient has a current code status but no historical code status.      Home/SNF/Other Skilled nursing facility  Chief Complaint fever  Level of Care/Admitting Diagnosis ED Disposition    ED Disposition Condition [facility]: [place] [[id number]]  Level of Care: Telemetry [5]  Admit to tele based on following criteria: Complex arrhythmia (Bradycardia/Tachycardia)  Diagnosis: UTI (urinary tract infection) [256389]  Admitting Physician: Elmarie Shiley [phone]  Attending Physician: Elmarie Shiley [phone]  Estimated length of stay: 3 - 4 days  Certification:: I certify this patient will need inpatient services for at least 2 midnights  PT Class (Do Not Modify): Inpatient [101]  PT Acc Code (Do Not Modify): Private [1]       Medical History Past Medical History:  Diagnosis Date  . Alzheimer's dementia ([place])   . Anorexia   . Anxiety   . Depression     Allergies Allergies  Allergen Reactions  . Sulfa Antibiotics Anaphylaxis  . Penicillins     IV Location/Drains/Wounds Patient Lines/Drains/Airways Status   Active Line/Drains/Airways    Name:   Placement date:   Placement time:   Site:   Days:    Peripheral IV 04/21/18 Right Hand   04/21/18    0028    Hand   less than 1   Peripheral IV 04/21/18 Left;Upper Arm   04/21/18    1124    Arm   less than 1          Labs/Imaging Results for orders placed or performed during the hospital encounter of 04/20/18 (from the past 48 hour(s))  Culture, blood (Routine x 2)     Status: None (Preliminary result)   Collection Time: 04/20/18 11:47 PM  Result Value Ref Range   Specimen Description BLOOD RIGHT HAND    Special Requests      BOTTLES DRAWN AEROBIC AND ANAEROBIC Blood Culture adequate volume Performed at [facility], [address] [place], [place] [address]    Culture NO GROWTH < 12 HOURS    Report Status PENDING   Urinalysis, Routine w reflex microscopic     Status: Abnormal   Collection Time: 04/21/18 12:08 AM  Result Value Ref Range   Color, Urine AMBER (A) YELLOW    Comment: BIOCHEMICALS MAY BE AFFECTED BY COLOR   APPearance CLOUDY (A) CLEAR   Specific Gravity, Urine 1.021 1.005 - 1.030   pH 7.0 5.0 - 8.0   Glucose, UA NEGATIVE NEGATIVE mg/dL   Hgb urine dipstick NEGATIVE NEGATIVE   Bilirubin Urine NEGATIVE NEGATIVE   Ketones, ur  NEGATIVE NEGATIVE mg/dL   Protein, ur 100 (A) NEGATIVE mg/dL   Nitrite NEGATIVE NEGATIVE   Leukocytes, UA MODERATE (A) NEGATIVE   RBC / HPF 6-10 0 - 5 RBC/hpf   WBC, UA >50 (H) 0 - 5 WBC/hpf   Bacteria, UA RARE (A) NONE SEEN   Squamous Epithelial / LPF 0-5 0 - 5   WBC Clumps PRESENT    Mucus PRESENT    Triple Phosphate Crystal PRESENT    Non Squamous Epithelial 0-5 (A) NONE SEEN    Comment: Performed at [facility], [place] [address], [place], [place] [address]  Comprehensive metabolic panel     Status: Abnormal   Collection Time: 04/21/18 12:29 AM  Result Value Ref Range   Sodium 142 135 - 145 mmol/L   Potassium 3.2 (L) 3.5 - 5.1 mmol/L   Chloride 107 98 - 111 mmol/L   CO2 25 22 - 32 mmol/L   Glucose, Bld 119 (H) 70 - 99 mg/dL   BUN 25 (H) 8 - 23 mg/dL    Creatinine, Ser 0.54 0.44 - 1.00 mg/dL   Calcium 9.0 8.9 - 10.3 mg/dL   Total Protein 7.0 6.5 - 8.1 g/dL   Albumin 3.6 3.5 - 5.0 g/dL   AST 293 (H) 15 - 41 U/L   ALT 790 (H) 0 - 44 U/L   Alkaline Phosphatase 156 (H) 38 - 126 U/L   Total Bilirubin 2.9 (H) 0.3 - 1.2 mg/dL   GFR calc non Af Amer >60 >60 mL/min   GFR calc Af Amer >60 >60 mL/min   Anion gap 10 5 - 15    Comment: Performed at [facility], [place] [address], [place], [place] [address]  Lactic acid, plasma     Status: None   Collection Time: 04/21/18 12:29 AM  Result Value Ref Range   Lactic Acid, Venous 1.3 0.5 - 1.9 mmol/L    Comment: Performed at [facility], [place] [address], [place], [place] [address]  CBC with Differential     Status: Abnormal   Collection Time: 04/21/18 12:29 AM  Result Value Ref Range   WBC 7.4 4.0 - 10.5 K/uL   RBC 4.19 3.87 - 5.11 MIL/uL   Hemoglobin 12.6 12.0 - 15.0 g/dL   HCT 40.3 36.0 - 46.0 %   MCV 96.2 80.0 - 100.0 fL   MCH 30.1 26.0 - 34.0 pg   MCHC 31.3 30.0 - 36.0 g/dL   RDW 14.2 11.5 - 15.5 %   Platelets 133 (L) 150 - 400 K/uL   nRBC 0.0 0.0 - 0.2 %   Neutrophils Relative % 88 %   Neutro Abs 6.4 1.7 - 7.7 K/uL   Lymphocytes Relative 5 %   Lymphs Abs 0.4 (L) 0.7 - 4.0 K/uL   Monocytes Relative 7 %   Monocytes Absolute 0.5 0.1 - 1.0 K/uL   Eosinophils Relative 0 %   Eosinophils Absolute 0.0 0.0 - 0.5 K/uL   Basophils Relative 0 %   Basophils Absolute 0.0 0.0 - 0.1 K/uL   Immature Granulocytes 0 %   Abs Immature Granulocytes 0.03 0.00 - 0.07 K/uL    Comment: Performed at [facility], [place] [address] [place], [place] [address]  Protime-INR     Status: None   Collection Time: 04/21/18 12:29 AM  Result Value Ref Range   Prothrombin Time 14.2 11.4 - 15.2 seconds   INR 1.10     Comment: Performed at [facility], [place] [address], [place], [place] [address]  Vitamin B12     Status: None   Collection Time:  04/21/18 12:29 AM  Result Value Ref Range   Vitamin B-12 548 180 - 914 pg/mL    Comment: (NOTE) This assay is not validated for testing neonatal or myeloproliferative syndrome specimens for Vitamin B12 levels. Performed at [facility], [place] [address] [place], [place] [address]    Dg Chest 2 View  Result Date: 04/21/2018 CLINICAL DATA:  Fever. Patient is nonverbal. EXAM: CHEST - 2 VIEW COMPARISON:  03/30/2016 FINDINGS: Shallow inspiration with linear atelectasis in the lung bases. Heart size and pulmonary vascularity are normal for technique. No consolidation or airspace disease in the lungs. No blunting of costophrenic angles. No pneumothorax. Mediastinal contours appear intact. Calcification of the aorta. IMPRESSION: Shallow inspiration with linear atelectasis in the lung bases. Electronically Signed   By: Lucienne Capers M.D.   On: 04/21/2018 00:53   Ct Abdomen Pelvis W Contrast  Result Date: 04/21/2018 CLINICAL DATA:  85 y/o  F; abdominal pain and fever. EXAM: CT ABDOMEN AND PELVIS WITH CONTRAST TECHNIQUE: Multidetector CT imaging of the abdomen and pelvis was performed using the standard protocol following bolus administration of intravenous contrast. CONTRAST:  146mL ISOVUE-300 IOPAMIDOL (ISOVUE-300) INJECTION 61% COMPARISON:  12/16/2014 CT abdomen and pelvis. FINDINGS: Lower chest: Mild dependent atelectasis of the lungs. Aortic valvular and coronary artery calcific atherosclerosis. Small hiatal hernia. Hepatobiliary: Stable subcentimeter cyst near the liver vascular pedicle. Cholelithiasis. No biliary ductal dilatation. Pancreas: Unremarkable. No pancreatic ductal dilatation or surrounding inflammatory changes. Spleen: Normal in size without focal abnormality. Adrenals/Urinary Tract: Normal adrenal glands. Multiple renal cysts measuring up to 17 mm rest and lower pole of right kidney. No urinary stone disease or hydronephrosis. Bladder wall thickening pericystic edema.  Stomach/Bowel: Stomach is within normal limits. Large volume of stool in the rectum may represent constipation. No evidence of bowel wall thickening, distention, or inflammatory changes. Vascular/Lymphatic: Aortic atherosclerosis. No enlarged abdominal or pelvic lymph nodes. Reproductive: Uterus and bilateral adnexa are unremarkable. Other: No abdominal wall hernia or abnormality. No abdominopelvic ascites. Musculoskeletal: No fracture is seen. IMPRESSION: 1. Bladder wall thickening and pericystic edema, likely cystitis. 2. Large volume of stool in the rectum may represent constipation. 3. Aortic valvular, aortic, and coronary artery calcific atherosclerosis. 4. Small hiatal hernia. 5. Cholelithiasis. Electronically Signed   By: Kristine Garbe M.D.   On: 04/21/2018 04:44   Mr Abdomen Mrcp Wo Contrast  Result Date: 04/21/2018 CLINICAL DATA:  Abnormal LFTs, cholelithiasis EXAM: MRI ABDOMEN WITHOUT CONTRAST  (INCLUDING MRCP) TECHNIQUE: Multiplanar multisequence MR imaging of the abdomen was performed. Heavily T2-weighted images of the biliary and pancreatic ducts were obtained, and three-dimensional MRCP images were rendered by post processing. COMPARISON:  CT abdomen/pelvis dated 04/21/2018 FINDINGS: Motion degraded images. Lower chest: Small bilateral pleural effusions with associated mild dependent atelectasis. Hepatobiliary: Liver is unremarkable.  No hepatic steatosis. Layering gallstones (series 3/image 32), without associated inflammatory changes. No intrahepatic ductal dilatation. Dilated common duct, measuring 12 mm (series 5/image 38). At least two distal CBD stones measuring up to 5 mm (series 5/image 38). Pancreas: Within normal limits. No peripancreatic fluid/inflammatory changes. Spleen:  Within normal limits. Adrenals/Urinary Tract:  Adrenal glands are within normal limits. Small bilateral renal cysts, measuring up to 1.8 cm in the anterior right lower kidney (series 3/image 40). No  hydronephrosis. Stomach/Bowel: Stomach is notable for a small hiatal hernia. Visualized bowel is grossly unremarkable. Vascular/Lymphatic:  No evidence of abdominal aortic aneurysm. No suspicious abdominal lymphadenopathy. Other:  No abdominal ascites. Musculoskeletal: No focal osseous lesions. IMPRESSION: Suspected choledocholithiasis, with two distal CBD stones measuring up to 5 mm. Common duct measures 12 mm. No intrahepatic ductal dilatation. ERCP is suggested. Cholelithiasis, without associated inflammatory changes. Additional ancillary findings as above. Electronically Signed   By: Julian Hy M.D.   On: 04/21/2018 10:13   Mr 3d Recon At Scanner  Result Date: 04/21/2018 CLINICAL DATA:  Abnormal LFTs, cholelithiasis EXAM: MRI ABDOMEN WITHOUT CONTRAST  (INCLUDING MRCP) TECHNIQUE: Multiplanar multisequence MR imaging of the abdomen was performed. Heavily T2-weighted images of the biliary and pancreatic ducts were obtained, and three-dimensional MRCP images were rendered by post processing. COMPARISON:  CT abdomen/pelvis dated 04/21/2018 FINDINGS: Motion degraded images. Lower chest: Small bilateral pleural effusions with associated mild dependent atelectasis. Hepatobiliary: Liver is unremarkable.  No hepatic steatosis. Layering gallstones (series 3/image 32), without associated inflammatory changes. No intrahepatic ductal dilatation. Dilated common duct, measuring 12 mm (series 5/image 38). At least two distal CBD stones measuring up to 5 mm (series 5/image 38). Pancreas: Within normal limits. No peripancreatic fluid/inflammatory changes. Spleen:  Within normal limits. Adrenals/Urinary Tract:  Adrenal glands are within normal limits. Small bilateral renal cysts, measuring up to 1.8 cm in the anterior right lower kidney (series 3/image 40). No hydronephrosis. Stomach/Bowel: Stomach is notable for a small hiatal hernia. Visualized bowel is grossly unremarkable. Vascular/Lymphatic:  No evidence of abdominal  aortic aneurysm. No suspicious abdominal lymphadenopathy. Other:  No abdominal ascites. Musculoskeletal: No focal osseous lesions. IMPRESSION: Suspected choledocholithiasis, with two distal CBD stones measuring up to 5 mm. Common duct measures 12 mm. No intrahepatic ductal dilatation. ERCP is suggested. Cholelithiasis, without associated inflammatory changes. Additional ancillary findings as above. Electronically Signed   By: Julian Hy M.D.   On: 04/21/2018 10:13   US Abdomen Limited  Result Date: 04/21/2018 CLINICAL DATA:  Right upper quadrant pain EXAM: ULTRASOUND ABDOMEN LIMITED RIGHT UPPER QUADRANT COMPARISON:  None. FINDINGS: Gallbladder: Low-level internal echoes within the gallbladder may represent biliary sludge. No sonographic Percell Miller sign was elicited. No wall thickening of the gallbladder nor pericholecystic fluid is identified. Common bile duct: Diameter: 6.7 mm and within normal limits for age. Liver: No focal lesion identified. Within normal limits in parenchymal echogenicity. Portal vein is patent on color Doppler imaging with normal direction of blood flow towards the liver. IMPRESSION: Small amount of biliary sludge is suggested within the gallbladder without secondary signs of acute cholecystitis. Electronically Signed   By: Ashley Royalty M.D.   On: 04/21/2018 03:11    Pending Labs Unresulted Labs (From admission, onward)    Start     Ordered   04/21/18 1022  CBC  (enoxaparin (LOVENOX)    CrCl < 30 ml/min)  Once,   R    Comments:  Baseline for enoxaparin therapy IF NOT ALREADY DRAWN.  Notify MD if PLT < 100 K.    04/21/18 1022   04/21/18 1022  Creatinine, serum  (enoxaparin (LOVENOX)    CrCl < 30 ml/min)  Once,   R    Comments:  Baseline for enoxaparin therapy IF NOT ALREADY DRAWN.    04/21/18 1022   04/21/18 1022  TSH  Once,   R     04/21/18 1022   04/21/18 0806  Magnesium  Once,   R     04/21/18 0805   04/21/18 0756  VITAMIN D 25 Hydroxy (Vit-D Deficiency, Fractures)   Once,  R     04/21/18 0755   04/21/18 0750  Hepatitis panel, acute  Once,   R     04/21/18 0750   04/21/18 0716  MRSA PCR Screening  Once,   R     04/21/18 0715   04/21/18 0713  Ammonia  Once,   R     04/21/18 0712   04/21/18 0339  Urine culture  ONCE - STAT,   STAT     04/21/18 0339   04/20/18 2342  Culture, blood (Routine x 2)  BLOOD CULTURE X 2,   STAT     04/20/18 2341          Vitals/Pain Today's Vitals   04/21/18 1245 04/21/18 1300 04/21/18 1315 04/21/18 1330  BP:  103/62  (!) 105/53  Pulse: 69 66 71 69  Resp: (!) 22 17 18 16   Temp:      TempSrc:      SpO2: 100% 96% 94% 93%  Weight:      Height:        Isolation Precautions No active isolations  Medications Medications  sodium chloride flush (NS) 0.9 % injection 3 mL (0 mLs Intravenous Hold 04/21/18 0959)  potassium chloride 10 mEq in 100 mL IVPB ( Intravenous Restarted 04/21/18 1228)  metroNIDAZOLE (FLAGYL) IVPB 500 mg (500 mg Intravenous New Bag/Given 04/21/18 0828)  0.9 % NaCl with KCl 20 mEq/ L  infusion ( Intravenous New Bag/Given 04/21/18 1052)  ceFEPIme (MAXIPIME) 1 g in sodium chloride 0.9 % 100 mL IVPB (1 g Intravenous New Bag/Given 04/21/18 1132)  antiseptic oral rinse (BIOTENE) solution 15 mL (has no administration in time range)  sertraline (ZOLOFT) tablet 25 mg (has no administration in time range)  enoxaparin (LOVENOX) injection 40 mg (has no administration in time range)  senna (SENOKOT) tablet 8.6 mg (8.6 mg Oral Given 04/21/18 1141)  polyethylene glycol (MIRALAX / GLYCOLAX) packet 17 g (17 g Oral Given 04/21/18 1140)  ondansetron (ZOFRAN) tablet 4 mg (has no administration in time range)    Or  ondansetron (ZOFRAN) injection 4 mg (has no administration in time range)  bisacodyl (DULCOLAX) suppository 10 mg (has no administration in time range)  ibuprofen (ADVIL,MOTRIN) tablet 400 mg (400 mg Oral Given 04/21/18 4562)    Or  ketorolac (TORADOL) 15 MG/ML injection 15 mg ( Intravenous See Alternative 04/21/18  5638)  mirtazapine (REMERON) tablet 15 mg (has no administration in time range)  ceFEPIme (MAXIPIME) 2 g in sodium chloride 0.9 % 100 mL IVPB (0 g Intravenous Stopped 04/21/18 0340)  vancomycin (VANCOCIN) IVPB 1000 mg/200 mL premix (0 mg Intravenous Stopped 04/21/18 0340)  iopamidol (ISOVUE-300) 61 % injection 100 mL (100 mLs Intravenous Contrast Given 04/21/18 0410)  sodium chloride 0.9 % bolus 500 mL (0 mLs Intravenous Stopped 04/21/18 1022)  sodium chloride 0.9 % bolus 500 mL (0 mLs Intravenous Stopped 04/21/18 1001)  bisacodyl (DULCOLAX) suppository 10 mg (10 mg Rectal Given 04/21/18 1051)    Mobility non-ambulatory

## 2018-04-21 NOTE — ED Notes (Signed)
Pt at MRI

## 2018-04-22 ENCOUNTER — Inpatient Hospital Stay (HOSPITAL_COMMUNITY): Payer: Medicare Other

## 2018-04-22 ENCOUNTER — Inpatient Hospital Stay (HOSPITAL_COMMUNITY): Payer: Medicare Other | Admitting: Registered Nurse

## 2018-04-22 ENCOUNTER — Encounter (HOSPITAL_COMMUNITY)
Admission: EM | Disposition: A | Discharge status: Nursing Home (Includes ICF) | Payer: Self-pay | Source: Skilled Nursing Facility | Attending: Family Medicine

## 2018-04-22 ENCOUNTER — Encounter (HOSPITAL_COMMUNITY): Payer: Self-pay | Admitting: Registered Nurse

## 2018-04-22 HISTORY — PX: SPHINCTEROTOMY: SHX5544

## 2018-04-22 HISTORY — PX: REMOVAL OF STONES: SHX5545

## 2018-04-22 HISTORY — PX: ERCP: SHX5425

## 2018-04-22 LAB — TSH: TSH: 1.866 u[IU]/mL (ref 0.350–4.500)

## 2018-04-22 LAB — HEPATITIS PANEL, ACUTE
HCV Ab: <0.1 s/co ratio (ref 0.0–0.9)
Hep A IgM: NEGATIVE
Hep B C IgM: NEGATIVE
Hepatitis B Surface Ag: NEGATIVE

## 2018-04-22 SURGERY — ERCP, WITH INTERVENTION IF INDICATED
Anesthesia: General

## 2018-04-22 MED ORDER — ONDANSETRON HCL 4 MG/2ML IJ SOLN
INTRAMUSCULAR | Status: DC | PRN
  Administered 2018-04-22: 4 mg via INTRAVENOUS

## 2018-04-22 MED ORDER — SODIUM CHLORIDE 0.9 % IV SOLN
INTRAVENOUS | Status: DC | PRN
  Administered 2018-04-22: 20 mL

## 2018-04-22 MED ORDER — INDOMETHACIN 50 MG RE SUPP
RECTAL | Status: AC
  Filled 2018-04-22: qty 1

## 2018-04-22 MED ORDER — LIDOCAINE 2% (20 MG/ML) 5 ML SYRINGE
INTRAMUSCULAR | Status: DC | PRN
  Administered 2018-04-22: 50 mg via INTRAVENOUS

## 2018-04-22 MED ORDER — FENTANYL CITRATE (PF) 100 MCG/2ML IJ SOLN
INTRAMUSCULAR | Status: DC | PRN
  Administered 2018-04-22 (×2): 25 ug via INTRAVENOUS

## 2018-04-22 MED ORDER — GLUCAGON HCL RDNA (DIAGNOSTIC) 1 MG IJ SOLR
INTRAMUSCULAR | Status: AC
  Filled 2018-04-22: qty 1

## 2018-04-22 MED ORDER — LACTATED RINGERS IV SOLN
INTRAVENOUS | Status: DC | PRN
  Administered 2018-04-22 (×2): via INTRAVENOUS

## 2018-04-22 MED ORDER — FENTANYL CITRATE (PF) 100 MCG/2ML IJ SOLN
INTRAMUSCULAR | Status: AC
  Filled 2018-04-22: qty 2

## 2018-04-22 MED ORDER — GLUCAGON HCL RDNA (DIAGNOSTIC) 1 MG IJ SOLR
INTRAMUSCULAR | Status: DC | PRN
  Administered 2018-04-22: .5 mg via INTRAVENOUS

## 2018-04-22 MED ORDER — PROPOFOL 10 MG/ML IV BOLUS
INTRAVENOUS | Status: AC
  Filled 2018-04-22: qty 20

## 2018-04-22 MED ORDER — LACTATED RINGERS IV SOLN
INTRAVENOUS | Status: DC
  Administered 2018-04-22: 11:00:00 via INTRAVENOUS

## 2018-04-22 MED ORDER — SUGAMMADEX SODIUM 200 MG/2ML IV SOLN
INTRAVENOUS | Status: DC | PRN
  Administered 2018-04-22: 150 mg via INTRAVENOUS

## 2018-04-22 MED ORDER — PROPOFOL 10 MG/ML IV BOLUS
INTRAVENOUS | Status: DC | PRN
  Administered 2018-04-22 (×2): 20 mg via INTRAVENOUS
  Administered 2018-04-22: 130 mg via INTRAVENOUS

## 2018-04-22 MED ORDER — ROCURONIUM BROMIDE 10 MG/ML (PF) SYRINGE
PREFILLED_SYRINGE | INTRAVENOUS | Status: DC | PRN
  Administered 2018-04-22: 50 mg via INTRAVENOUS

## 2018-04-22 MED ORDER — CIPROFLOXACIN IN D5W 400 MG/200ML IV SOLN
INTRAVENOUS | Status: AC
  Filled 2018-04-22: qty 200

## 2018-04-22 MED ORDER — INDOMETHACIN 50 MG RE SUPP
RECTAL | Status: DC | PRN
  Administered 2018-04-22: 100 mg via RECTAL

## 2018-04-22 NOTE — Progress Notes (Signed)
[facility] PROGRESS NOTE  Wendy Hammond VZD:[id number] DOB: [birth year] DOA: 04/20/2018 PCP: Lajean Manes, MD   Narrative: 85 year old Caucasian female History of dementia, bipolar, prior UTIs and former smoker Found to have fever and as LFTs were elevated thought was given to urinary tract infection Ultrasound abdomen pelvis showed biliary sludge and gallbladder CT abdomen pelvis showed cholelithiasis bladder wall thickening and pericystic edema large stool in rectum Potassium 3.2 BUN/creatinine 9/0.6 Alk phos 156 yet AST/ALT 293/790  bilirubin 2.9 ammonia 31 Lactic acid 1.3  GI consulted MRCP suspected choledocholithiasis to distal CBD stones up to 5 mm CBD 11m  A & Plan [facility] done as [facility] Diet as per GI but expect will be here at least overnight and will recheck LFTs a.m. Continue KCl in saline but cut back rate to 50 cc an hour Continuing Flagyl and cefepime needs 10-day course ending 05/01/2012 Bipolar Continue Remeron 15 at bedtime sertraline 25 at bedtime Moderate to severe dementia ?  UTI Follow culture but I expect would be covered by empiric antibiotic coverage   DVT Lovenox code Status: DNR communication: Discussed with family at bedside prior to procedure understands disposition Plan: Inpatient neck several days   SVerlon Au MD  [facility] [email or web address] 7PM-7AM contact night coverage as above 04/22/2018, 8:05 AM  LOS: 1 day   Consultants:  GI  Procedures:  ERCP as below  Antimicrobials:  Flagyl cefepime as above  Interval history/Subjective: Awake alert but nonverbal does respond to stimulus however Multiple family members later at the bedside  Objective:  Vitals:  Vitals:   04/21/18 2036 04/22/18 0513  BP: 129/68 100/84  Pulse: 89 76  Resp: 17 17  Temp: 98.6 F (37 C) 98.4 F (36.9 C)  SpO2: 99% 99%    Exam:  Awake pleasant alert no icterus no pallor Chest  clinically clear no added sound Abdomen is slightly tender in right upper quadrant no rebound Slightly distended abdomen No lower extremity edema   I have personally reviewed the following:  DATA   Labs:  BUN/creatinine 25/0.5 alk phos 156 AST ALT 293/790 bilirubin 2.9  White count 7.4 WBC 12  Ammonia 31  Magnesium 2.2  Imaging studies: Impression:               - Small periampullary diverticulum, the major                            papilla appeared otherwise normal.                           - A filling defect consistent with a stones were                            seen on the cholangiogram.                           - A biliary sphincterotomy was performed.                           - Choledocholithiasis was found, and stones were                            removed as above. No residual bile duct  stones                            noted.                           - The biliary tree was swept. Bile flow good                            post-procedure. Bile appeared normal in                            color/texture; no purulence was appreciated.   Scheduled Meds: . enoxaparin (LOVENOX) injection  40 mg Subcutaneous Q24H  . indomethacin  100 mg Rectal Once  . mirtazapine  15 mg Oral QHS  . polyethylene glycol  17 g Oral Daily  . senna  1 tablet Oral BID  . sertraline  25 mg Oral QHS  . sodium chloride flush  3 mL Intravenous Once   Continuous Infusions: . 0.9 % NaCl with KCl 20 mEq / L 100 mL/hr at 04/21/18 2242  . ceFEPime (MAXIPIME) IV 1 g (04/21/18 2253)  . ciprofloxacin    . metronidazole 500 mg (04/22/18 0601)    Principal Problem:   Abnormal transaminases Active Problems:   Dementia with behavioral disturbance (HCC)   Fever   Alzheimer's dementia (HCC)   Hypokalemia   Acute lower UTI   UTI (urinary tract infection)   LOS: 1 day

## 2018-04-22 NOTE — Anesthesia Preprocedure Evaluation (Addendum)
Anesthesia Evaluation  Patient identified by MRN, date of birth, ID band Patient awake    Reviewed: Allergy & Precautions, NPO status , Patient's Chart, lab work & pertinent test results  History of Anesthesia Complications Negative for: history of anesthetic complications  Airway Mallampati: II  TM Distance: >3 FB Neck ROM: Full    Dental  (+) Dental Advisory Given, Missing, Poor Dentition, Chipped   Pulmonary former smoker,    breath sounds clear to auscultation       Cardiovascular negative cardio ROS   Rhythm:Regular Rate:Normal     Neuro/Psych PSYCHIATRIC DISORDERS (Alzheimer's) Anxiety Depression Dementia    GI/Hepatic negative GI ROS, CBD stones with elevated LFTs   Endo/Other  negative endocrine ROSobese  Renal/GU negative Renal ROS     Musculoskeletal Wheelchair bound   Abdominal (+) + obese,   Peds  Hematology negative hematology ROS (+)   Anesthesia Other Findings   Reproductive/Obstetrics                           Anesthesia Physical Anesthesia Plan  ASA: III  Anesthesia Plan: General   Post-op Pain Management:    Induction: Intravenous  PONV Risk Score and Plan: 3 and Ondansetron, Dexamethasone and Treatment may vary due to age or medical condition  Airway Management Planned: Oral ETT  Additional Equipment:   Intra-op Plan:   Post-operative Plan: Extubation in OR  Informed Consent: I have reviewed the patients History and Physical, chart, labs and discussed the procedure including the risks, benefits and alternatives for the proposed anesthesia with the patient or authorized representative who has indicated his/her understanding and acceptance.   Patient has DNR.  Discussed DNR with power of attorney and Suspend DNR.   Dental advisory given and Consent reviewed with POA  Plan Discussed with: Surgeon and CRNA  Anesthesia Plan Comments: (Plan routine  monitors, GETA)        Anesthesia Quick Evaluation

## 2018-04-22 NOTE — Anesthesia Postprocedure Evaluation (Signed)
Anesthesia Post Note  Patient: Wendy Hammond  Procedure(s) Performed: ENDOSCOPIC RETROGRADE CHOLANGIOPANCREATOGRAPHY (ERCP) (N/A ) SPHINCTEROTOMY REMOVAL OF STONES     Patient location during evaluation: PACU Anesthesia Type: General Level of consciousness: awake and alert, patient cooperative and confused Pain management: pain level controlled Vital Signs Assessment: post-procedure vital signs reviewed and stable Respiratory status: spontaneous breathing, nonlabored ventilation, respiratory function stable and patient connected to nasal cannula oxygen Cardiovascular status: blood pressure returned to baseline and stable Postop Assessment: no apparent nausea or vomiting Anesthetic complications: yes (broken tooth, 2nd bicuspid/pre-molar on pt's left) Comments: Discussed dental damage with pt's daughter, Harley Alto, and grand-daughter. Pt is unaware that tooth is broken.Tooth fragment given to them and showed them where tooth broke. Interestingly, they were not aware that pt was missing crown on neighboring tooth. They will contact dentist for eval and filing of sharp edge. [place] office contact info given to daughter.     Last Vitals:  Vitals:   04/22/18 1300 04/22/18 1315  BP: 124/70 111/82  Pulse: 64 (!) 58  Resp: 19 16  Temp:  36.7 C  SpO2: 93% 92%    Last Pain:  Vitals:   04/22/18 1020  TempSrc: Oral                 Trev Boley,E. Kae Lauman

## 2018-04-22 NOTE — Progress Notes (Signed)
Pt has returned to Room 1514 from PACU. VS WNL O2 at 2 L/min via [place].

## 2018-04-22 NOTE — Addendum Note (Signed)
Addendum  created 04/22/18 1337 by Annye Asa, MD   Intraprocedure Event edited

## 2018-04-22 NOTE — Transfer of Care (Signed)
Immediate Anesthesia Transfer of Care Note  Patient: Wendy Hammond  Procedure(s) Performed: ENDOSCOPIC RETROGRADE CHOLANGIOPANCREATOGRAPHY (ERCP) (N/A ) SPHINCTEROTOMY REMOVAL OF STONES  Patient Location: PACU  Anesthesia Type:General  Level of Consciousness: awake, alert , oriented and patient cooperative  Airway & Oxygen Therapy: Patient Spontanous Breathing and Patient connected to face mask oxygen  Post-op Assessment: Report given to RN, Post -op Vital signs reviewed and stable and Patient moving all extremities X 4  Post vital signs: stable  Last Vitals:  Vitals Value Taken Time  BP 117/62 04/22/2018 12:30 PM  Temp    Pulse 84 04/22/2018 12:30 PM  Resp 17 04/22/2018 12:36 PM  SpO2 71 % 04/22/2018 12:30 PM  Vitals shown include unvalidated device data.  Last Pain:  Vitals:   04/22/18 1020  TempSrc: Oral         Complications: No apparent anesthesia complications

## 2018-04-22 NOTE — Anesthesia Procedure Notes (Signed)
Procedure Name: Intubation Date/Time: 04/22/2018 11:12 AM Performed by: Lissa Morales, CRNA Pre-anesthesia Checklist: Patient identified, Emergency Drugs available, Suction available, Patient being monitored and Timeout performed Patient Re-evaluated:Patient Re-evaluated prior to induction Oxygen Delivery Method: Circle system utilized Preoxygenation: Pre-oxygenation with 100% oxygen Induction Type: IV induction Ventilation: Mask ventilation without difficulty Laryngoscope Size: Glidescope (after unable to visualize with MAC 3) Grade View: Grade IV Tube type: Oral Tube size: 7.0 mm Number of attempts: 3 (CRNA and MD MAC 3, CRNA VideoGlide) Placement Confirmation: ETT inserted through vocal cords under direct vision,  positive ETCO2 and breath sounds checked- equal and bilateral Secured at: 22 cm Tube secured with: Tape Dental Injury: Dental damage  Difficulty Due To: Difficulty was unanticipated and Difficult Airway- due to reduced neck mobility Comments: Teeth very sharp and brittle, easy VideoGlide

## 2018-04-22 NOTE — Progress Notes (Signed)
Pt has gone with staff for surgery this morning. Family present and aware.

## 2018-04-22 NOTE — Interval H&P Note (Signed)
History and Physical Interval Note:  04/22/2018 10:36 AM  Wendy Hammond  has presented today for surgery, with the diagnosis of bile duct stones, elevated liver enzymes  The various methods of treatment have been discussed with the patient and family. After consideration of risks, benefits and other options for treatment, the patient has consented to  Procedure(s): ENDOSCOPIC RETROGRADE CHOLANGIOPANCREATOGRAPHY (ERCP) (N/A) as a surgical intervention .  The patient's history has been reviewed, patient examined, no change in status, stable for surgery.  I have reviewed the patient's chart and labs.  Questions were answered to the patient's satisfaction.     [name]

## 2018-04-22 NOTE — Op Note (Signed)
[facility] Patient Name: Wendy Hammond Procedure Date: 04/22/2018 MRN: [id number] Attending MD: Arta Silence , MD Date of Birth: 03-May-1930 CSN: [id number] Age: 85 Admit Type: Inpatient Procedure:                ERCP Indications:              Common bile duct stone(s), Suspected ascending                            cholangitis, Exclusion of ascending cholangitis,                            Elevated liver enzymes, sludge/stones in gallbladder Providers:                Arta Silence, MD, Raynelle Bring, RN, Vista Lawman,                            RN, Laverda Sorenson, Technician, , Arnoldo Hooker, CRNA Referring MD:             [facility] Medicines:                General Anesthesia, Cipro 400 mg IV, Indomethacin                            100 mg PR, glucagon 0.5 mg Complications:            No immediate complications. Estimated Blood Loss:     Estimated blood loss: none. Procedure:                Pre-Anesthesia Assessment:                           - Prior to the procedure, a History and Physical                            was performed, and patient medications and                            allergies were reviewed. The patient's tolerance of                            previous anesthesia was also reviewed. The risks                            and benefits of the procedure and the sedation                            options and risks were discussed with the patient.                            All questions were answered, and informed consent                            was obtained. Prior Anticoagulants: The patient has  taken no previous anticoagulant or antiplatelet                            agents. ASA Grade Assessment: III - A patient with                            severe systemic disease. After reviewing the risks                            and benefits, the patient was deemed in                            satisfactory condition to  undergo the procedure.                           After obtaining informed consent, the scope was                            passed under direct vision. Throughout the                            procedure, the patient's blood pressure, pulse, and                            oxygen saturations were monitored continuously. The                            TJF-Q180V ([id number] Olympus duodenoscope was                            introduced through the mouth, and used to inject                            contrast into and used to inject contrast into the                            bile duct. The ERCP was accomplished without                            difficulty. The patient tolerated the procedure                            well. Scope In: Scope Out: Findings:      The scout film was normal. Very small periampullary diverticulum,       otherwise the major papilla was normal. The bile duct was deeply       cannulated. Contrast was injected. I personally interpreted the bile       duct images. Ductal flow of contrast was adequate. Image quality was       adequate. Contrast extended to the bifurcation. The lower third of the       main bile duct contained filling defect(s) thought to be a stone(s). The       biliary tree was prominent around 67mm. A 6 mm biliary sphincterotomy  was made with a traction (standard) sphincterotome using blended       current. There was no post-sphincterotomy bleeding. The biliary tree was       swept with a basket starting at the upper third of the main bile duct,       middle third of the main bile duct and lower third of the main duct. One       stone was removed. One oval black stone remained. The biliary tree was       swept with a 12 mm balloon starting at the upper third of the main bile       duct, middle third of the main bile duct and lower third of the main       duct. A few black stones, largest about 84mm, were removed. No stones       remained upon  completion occlusion cholangiogram. Bile flow through the       ampulla was adequate post-procedure. Pancreatogram was not obtained       (intentionally). Impression:               - Small periampullary diverticulum, the major                            papilla appeared otherwise normal.                           - A filling defect consistent with a stones were                            seen on the cholangiogram.                           - A biliary sphincterotomy was performed.                           - Choledocholithiasis was found, and stones were                            removed as above. No residual bile duct stones                            noted.                           - The biliary tree was swept. Bile flow good                            post-procedure. Bile appeared normal in                            color/texture; no purulence was appreciated. Moderate Sedation:      Not Applicable - Patient had care per Anesthesia. Recommendation:           - Watch for potential complications of procedure                            (bleeding, infection, perforation, pancreatitis).                           -  Avoid aspirin and nonsteroidal anti-inflammatory                            medicines for 3 days.                           - Return patient to hospital ward for observation.                           - Clear liquid diet today.                           - Continue present medications.                           [facility] will follow. Given patient's age and                            comorbidities, family and I have discussed not                            pursuing cholecystectomy unless absolutely                            necessary (hopefully the biliary sphincterotomy                            will suffice should patient have passage of any                            further gallstones into the biliary tree).                           - Continue antibiotics to complete  10-day course.                           - If no obvious post-procedural complications,                            would expect patient should be able to be                            discharged home within the next few days. Procedure Code(s):        --- Professional ---                           [phone], Endoscopic retrograde                            cholangiopancreatography (ERCP); with removal of                            calculi/debris from biliary/pancreatic duct(s)                           43262, Endoscopic retrograde  cholangiopancreatography (ERCP); with                            sphincterotomy/papillotomy Diagnosis Code(s):        --- Professional ---                           K80.50, Calculus of bile duct without cholangitis                            or cholecystitis without obstruction                           R74.8, Abnormal levels of other serum enzymes                           R93.2, Abnormal findings on diagnostic imaging of                            liver and biliary tract CPT copyright 2018 American Medical Association. All rights reserved. The codes documented in this report are preliminary and upon coder review may  be revised to meet current compliance requirements. Arta Silence, MD 04/22/2018 12:14:10 PM This report has been signed electronically. Number of Addenda: 0

## 2018-04-23 ENCOUNTER — Encounter (HOSPITAL_COMMUNITY): Payer: Self-pay | Admitting: Gastroenterology

## 2018-04-23 LAB — CULTURE, BLOOD (ROUTINE X 2): Special Requests: ADEQUATE

## 2018-04-23 LAB — BASIC METABOLIC PANEL
Anion gap: 3 — ABNORMAL LOW (ref 5–15)
BUN: 12 mg/dL (ref 8–23)
CO2: 24 mmol/L (ref 22–32)
Calcium: 7.9 mg/dL — ABNORMAL LOW (ref 8.9–10.3)
Chloride: 117 mmol/L — ABNORMAL HIGH (ref 98–111)
Creatinine, Ser: 0.53 mg/dL (ref 0.44–1.00)
GFR calc Af Amer: >60 mL/min (ref >60)
GFR calc non Af Amer: >60 mL/min (ref >60)
Glucose, Bld: 100 mg/dL — ABNORMAL HIGH (ref 70–99)
Potassium: 3.9 mmol/L (ref 3.5–5.1)
Sodium: 144 mmol/L (ref 135–145)

## 2018-04-23 LAB — CBC WITH DIFFERENTIAL/PLATELET
ABS IMMATURE GRANULOCYTES: 0.02 10*3/uL (ref 0.00–0.07)
Basophils Absolute: 0 10*3/uL (ref 0.0–0.1)
Basophils Relative: 0 %
Eosinophils Absolute: 0.2 10*3/uL (ref 0.0–0.5)
Eosinophils Relative: 4 %
HCT: 35.6 % — ABNORMAL LOW (ref 36.0–46.0)
Hemoglobin: 10.7 g/dL — ABNORMAL LOW (ref 12.0–15.0)
IMMATURE GRANULOCYTES: 0 %
Lymphocytes Relative: 13 %
Lymphs Abs: 0.7 10*3/uL (ref 0.7–4.0)
MCH: 29.2 pg (ref 26.0–34.0)
MCHC: 30.1 g/dL (ref 30.0–36.0)
MCV: 97.3 fL (ref 80.0–100.0)
Monocytes Absolute: 0.6 10*3/uL (ref 0.1–1.0)
Monocytes Relative: 11 %
NEUTROS PCT: 72 %
NRBC: 0 % (ref 0.0–0.2)
Neutro Abs: 3.8 10*3/uL (ref 1.7–7.7)
Platelets: 89 10*3/uL — ABNORMAL LOW (ref 150–400)
RBC: 3.66 MIL/uL — ABNORMAL LOW (ref 3.87–5.11)
RDW: 14.1 % (ref 11.5–15.5)
WBC: 5.3 10*3/uL (ref 4.0–10.5)

## 2018-04-23 LAB — VITAMIN D 25 HYDROXY (VIT D DEFICIENCY, FRACTURES): Vit D, 25-Hydroxy: 29.8 ng/mL — ABNORMAL LOW (ref 30.0–100.0)

## 2018-04-23 LAB — HEPATIC FUNCTION PANEL
ALT: 207 U/L — ABNORMAL HIGH (ref 0–44)
AST: 44 U/L — ABNORMAL HIGH (ref 15–41)
Albumin: 2.7 g/dL — ABNORMAL LOW (ref 3.5–5.0)
Alkaline Phosphatase: 99 U/L (ref 38–126)
BILIRUBIN DIRECT: 0.4 mg/dL — AB (ref 0.0–0.2)
BILIRUBIN TOTAL: 0.8 mg/dL (ref 0.3–1.2)
Indirect Bilirubin: 0.4 mg/dL (ref 0.3–0.9)
Total Protein: 5.3 g/dL — ABNORMAL LOW (ref 6.5–8.1)

## 2018-04-23 LAB — URINE CULTURE: Culture: >=100000 — AB

## 2018-04-23 MED ORDER — CEFDINIR 300 MG PO CAPS
300.0000 mg | ORAL_CAPSULE | Freq: Two times a day (BID) | ORAL | Status: DC
  Filled 2018-04-23: qty 1

## 2018-04-23 MED ORDER — METRONIDAZOLE 500 MG PO TABS
500.0000 mg | ORAL_TABLET | Freq: Three times a day (TID) | ORAL | Status: DC
  Administered 2018-04-24 – 2018-04-25 (×4): 500 mg via ORAL
  Filled 2018-04-23 (×6): qty 1

## 2018-04-23 MED ORDER — CIPROFLOXACIN HCL 500 MG PO TABS
500.0000 mg | ORAL_TABLET | Freq: Two times a day (BID) | ORAL | Status: DC
  Administered 2018-04-23 – 2018-04-25 (×4): 500 mg via ORAL
  Filled 2018-04-23 (×5): qty 1

## 2018-04-23 NOTE — Progress Notes (Signed)
[facility] Gastroenterology Progress Note  Wendy Hammond 85 y.o. September 30, 1930   Subjective: Lying in bed. Nonverbal.  Objective: Vital signs: Vitals:   04/22/18 2025 04/23/18 0615  BP: 126/64 (!) 125/57  Pulse: 78 63  Resp: 18 18  Temp: 98.1 F (36.7 C) 98.4 F (36.9 C)  SpO2: 97% 100%    Physical Exam: Gen: elderly, frail, well-nourished, no acute distress  HEENT: anicteric sclera CV: RRR Chest: CTA B Abd: diffuse tenderness with guarding, soft, nondistended, +BS Ext: no edema  Lab Results: Recent Labs    04/21/18 0029 04/21/18 1821 04/23/18 0546  NA 142  --  144  K 3.2*  --  3.9  CL 107  --  117*  CO2 25  --  24  GLUCOSE 119*  --  100*  BUN 25*  --  12  CREATININE 0.54  --  0.53  CALCIUM 9.0  --  7.9*  MG  --  2.2  --    Recent Labs    04/21/18 0029  AST 293*  ALT 790*  ALKPHOS 156*  BILITOT 2.9*  PROT 7.0  ALBUMIN 3.6   Recent Labs    04/21/18 0029 04/23/18 0546  WBC 7.4 5.3  NEUTROABS 6.4 3.8  HGB 12.6 10.7*  HCT 40.3 35.6*  MCV 96.2 97.3  PLT 133* 89*      Assessment/Plan: Choledocholithiasis - s/p ERCP with removal and sphincterotomy yesterday. Recheck HFP. Supportive care. Will follow.   [name] 04/23/2018, 10:07 AM  Questions please call [phone] ID: Wendy Hammond, female   DOB: [birth year], 85 y.o.   MRN: [id number]

## 2018-04-23 NOTE — Addendum Note (Signed)
Addendum  created 04/23/18 3664 by Lollie Sails, CRNA   Charge Capture section accepted, Visit diagnoses modified

## 2018-04-23 NOTE — Progress Notes (Signed)
[facility] PROGRESS NOTE  Wendy Hammond WTU:[id number] DOB: Aug 25, 1930 DOA: 04/20/2018 PCP: Lajean Manes, MD   Narrative: 85 year old Caucasian female History of dementia, bipolar, prior UTIs and former smoker Found to have fever and as LFTs were elevated thought was given to urinary tract infection Ultrasound abdomen pelvis showed biliary sludge and gallbladder CT abdomen pelvis showed cholelithiasis bladder wall thickening and pericystic edema large stool in rectum Potassium 3.2 BUN/creatinine 9/0.6 Alk phos 156 yet AST/ALT 293/790  bilirubin 2.9 ammonia 31 Lactic acid 1.3  GI consulted MRCP suspected choledocholithiasis to distal CBD stones up to 5 mm CBD 37m  A & Plan [facility] as [facility] Transitioned from IV antibiotics to Cipro and Flagyl given she has Enterobacter in blood--would treat for minimum of 14 days Diet as per GI-currently on clear liquid LFTs not done for some reason so awaiting repeat Bipolar Continue Remeron 15 at bedtime sertraline 25 at bedtime Moderate to severe dementia ?  UTI Follow culture but I expect would be covered by empiric antibiotic coverage  Hypokalemia Replacing with IV fluid 50 cc/h 20 of K Repeat labs a.m.  DVT Lovenox code Status: DNR communication: No family at the bedside currently disposition Plan: Inpatient neck several days   SVerlon Au MD  [facility] [email or web address] 7PM-7AM contact night coverage as above 04/23/2018, 10:27 AM  LOS: 2 days   Consultants:  GI  Procedures:  ERCP as below  Antimicrobials:  Flagyl cefepime as above-transition to Cipro and Flagyl 2/3  Interval history/Subjective:  Awake alert some tenderness in abdomen but seems to be sitting in urine and may be some stool  Objective:  Vitals:  Vitals:   04/22/18 2025 04/23/18 0615  BP: 126/64 (!) 125/57  Pulse: 78 63  Resp: 18 18  Temp: 98.1 F (36.7 C) 98.4 F (36.9  C)  SpO2: 97% 100%    Exam:  Awake awake nonverbal Winces in pain on pressing abdomen Chest clear Abdomen distended slightly No lower extremity edema Patient is sitting in urine    I have personally reviewed the following:  DATA   Labs:  BUN/creatinine down from 25/0.5 to 12/0.5 LFTs are pending  White count 5 hemoglobin 10  Imaging studies: Impression:               - Small periampullary diverticulum, the major                            papilla appeared otherwise normal.                           - A filling defect consistent with a stones were                            seen on the cholangiogram.                           - A biliary sphincterotomy was performed.                           - Choledocholithiasis was found, and stones were                            removed as above. No  residual bile duct stones                            noted.                           - The biliary tree was swept. Bile flow good                            post-procedure. Bile appeared normal in                            color/texture; no purulence was appreciated.   Scheduled Meds: . ciprofloxacin  500 mg Oral BID  . enoxaparin (LOVENOX) injection  40 mg Subcutaneous Q24H  . indomethacin  100 mg Rectal Once  . metroNIDAZOLE  500 mg Oral Q8H  . mirtazapine  15 mg Oral QHS  . polyethylene glycol  17 g Oral Daily  . senna  1 tablet Oral BID  . sertraline  25 mg Oral QHS  . sodium chloride flush  3 mL Intravenous Once   Continuous Infusions: . 0.9 % NaCl with KCl 20 mEq / L 50 mL/hr at 04/23/18 0548    Principal Problem:   Abnormal transaminases Active Problems:   Dementia with behavioral disturbance (HCC)   Fever   Alzheimer's dementia (HCC)   Hypokalemia   Acute lower UTI   UTI (urinary tract infection)   LOS: 2 days          

## 2018-04-24 LAB — CBC WITH DIFFERENTIAL/PLATELET
Abs Immature Granulocytes: 0.03 10*3/uL (ref 0.00–0.07)
Basophils Absolute: 0 10*3/uL (ref 0.0–0.1)
Basophils Relative: 1 %
Eosinophils Absolute: 0.3 10*3/uL (ref 0.0–0.5)
Eosinophils Relative: 4 %
HCT: 35.4 % — ABNORMAL LOW (ref 36.0–46.0)
Hemoglobin: 10.8 g/dL — ABNORMAL LOW (ref 12.0–15.0)
Immature Granulocytes: 1 %
LYMPHS ABS: 1.1 10*3/uL (ref 0.7–4.0)
LYMPHS PCT: 20 %
MCH: 30.1 pg (ref 26.0–34.0)
MCHC: 30.5 g/dL (ref 30.0–36.0)
MCV: 98.6 fL (ref 80.0–100.0)
Monocytes Absolute: 0.5 10*3/uL (ref 0.1–1.0)
Monocytes Relative: 9 %
Neutro Abs: 3.7 10*3/uL (ref 1.7–7.7)
Neutrophils Relative %: 65 %
Platelets: 114 10*3/uL — ABNORMAL LOW (ref 150–400)
RBC: 3.59 MIL/uL — ABNORMAL LOW (ref 3.87–5.11)
RDW: 14 % (ref 11.5–15.5)
WBC: 5.7 10*3/uL (ref 4.0–10.5)
nRBC: 0 % (ref 0.0–0.2)

## 2018-04-24 LAB — COMPREHENSIVE METABOLIC PANEL
ALT: 154 U/L — ABNORMAL HIGH (ref 0–44)
AST: 35 U/L (ref 15–41)
Albumin: 2.4 g/dL — ABNORMAL LOW (ref 3.5–5.0)
Alkaline Phosphatase: 91 U/L (ref 38–126)
Anion gap: 5 (ref 5–15)
BUN: 6 mg/dL — ABNORMAL LOW (ref 8–23)
CHLORIDE: 114 mmol/L — AB (ref 98–111)
CO2: 22 mmol/L (ref 22–32)
Calcium: 7.8 mg/dL — ABNORMAL LOW (ref 8.9–10.3)
Creatinine, Ser: 0.51 mg/dL (ref 0.44–1.00)
GFR calc Af Amer: >60 mL/min (ref >60)
GFR calc non Af Amer: >60 mL/min (ref >60)
Glucose, Bld: 91 mg/dL (ref 70–99)
Potassium: 3.9 mmol/L (ref 3.5–5.1)
Sodium: 141 mmol/L (ref 135–145)
Total Bilirubin: 0.5 mg/dL (ref 0.3–1.2)
Total Protein: 4.8 g/dL — ABNORMAL LOW (ref 6.5–8.1)

## 2018-04-24 MED ORDER — SERTRALINE HCL 50 MG PO TABS: 25.0000 mg | ORAL_TABLET | Freq: Every day | ORAL | 0 refills | Status: AC

## 2018-04-24 MED ORDER — CIPROFLOXACIN HCL 500 MG PO TABS: 500.0000 mg | ORAL_TABLET | Freq: Two times a day (BID) | ORAL | 0 refills | Status: AC

## 2018-04-24 MED ORDER — METRONIDAZOLE 500 MG PO TABS: 500.0000 mg | ORAL_TABLET | Freq: Three times a day (TID) | ORAL | 0 refills | Status: AC

## 2018-04-24 MED ORDER — MIRTAZAPINE 15 MG PO TABS: 15.0000 mg | ORAL_TABLET | Freq: Every day | ORAL | 0 refills | Status: AC

## 2018-04-24 NOTE — Progress Notes (Signed)
[facility] Gastroenterology Progress Note  Wendy Hammond 85 y.o. 1930/11/14   Subjective: Resting in bed. Husband at bedside.  Objective: Vital signs: Vitals:   04/23/18 2043 04/24/18 0506  BP: 126/80 105/60  Pulse: 73 70  Resp: 14 18  Temp: 99.6 F (37.6 C) 98.4 F (36.9 C)  SpO2: 94% 92%    Physical Exam: Gen: lethargic, elderly, no acute distress  HEENT: anicteric sclera CV: RRR Chest: CTA B Abd: decreased tenderness in RUQ with guarding, soft, nondistended, +BS  Lab Results: Recent Labs    04/21/18 1821 04/23/18 0546 04/24/18 0617  NA  --  144 141  K  --  3.9 3.9  CL  --  117* 114*  CO2  --  24 22  GLUCOSE  --  100* 91  BUN  --  12 6*  CREATININE  --  0.53 0.51  CALCIUM  --  7.9* 7.8*  MG 2.2  --   --    Recent Labs    04/23/18 1058 04/24/18 0617  AST 44* 35  ALT 207* 154*  ALKPHOS 99 91  BILITOT 0.8 0.5  PROT 5.3* 4.8*  ALBUMIN 2.7* 2.4*   Recent Labs    04/23/18 0546 04/24/18 0617  WBC 5.3 5.7  NEUTROABS 3.8 3.7  HGB 10.7* 10.8*  HCT 35.6* 35.4*  MCV 97.3 98.6  PLT 89* 114*      Assessment/Plan: Choledocholithiasis - s/p ERCP on 04/22/2018 with removal of stones and sphincterotomy. LFTs improving. Stable from GI standpoint. On soft diet. Will sign off. Call back if questions. F/U with GI (Dr. Paulita Fujita) prn.   [name] 04/24/2018, 11:40 AM  Questions please call [phone] ID: Wendy Hammond, female   DOB: 08-17-1930, 85 y.o.   MRN: [id number]

## 2018-04-24 NOTE — Care Management Note (Addendum)
Case Management Note  Patient Details  Name: Wendy Hammond MRN: [id number] Date of Birth: 15-Jun-1930  Subjective/Objective:                  Discharge planning  Action/Plan: Home 02 at 2l/min texted to [facility] with advanced dme for o2/facility called and they do not have preferred provider Per [place] if advanced dme-no qualifying dz for the need of the o2.  Dr. Verlon Au notified. Expected Discharge Date:  04/24/18               Expected Discharge Plan:  Assisted Living / Rest Home  In-House Referral:  Clinical Social Work  Discharge planning Services  CM Consult  Post Acute Care Choice:  Durable Medical Equipment Choice offered to:  NA  DME Arranged:  Oxygen DME Agency:  [place]:    [place] Agency:     Status of Service:  Completed, signed off  If discussed at [employer] of Stay Meetings, dates discussed:    Additional Comments:  Leeroy Cha, RN 04/24/2018, 2:16 PM

## 2018-04-24 NOTE — Progress Notes (Signed)
Pt refused to take any PO meds this evening. Meds were crushed and mixed with puree-- RN explained to pt that meds were mixed in with pudding. Pt clenched mouth shut and closed her eyes. RN "woke" pt up to take meds and pt clenched mouth harder and shut eyes again.

## 2018-04-24 NOTE — NC FL2 (Signed)
[place] LEVEL OF CARE SCREENING TOOL     IDENTIFICATION  Patient Name: Wendy Hammond Birthdate: 09/14/1930 Sex: female Admission Date (Current Location): 04/20/2018  [facility] and [place] Number:  Herbalist and Address:  [facility] [place],  [place] [address], [place]      Provider Number: [id number]  Attending Physician Name and Address:  Nita Sells, MD  Relative Name and Phone Number:       Current Level of Care: Hospital Recommended Level of Care: [place] Prior Approval Number:    Date Approved/Denied:   PASRR Number:    Discharge Plan: Other (Comment)(ALF)    Current Diagnoses: Patient Active Problem List   Diagnosis Date Noted  . Fever 04/21/2018  . Alzheimer's dementia ([place]) 04/21/2018  . Hypokalemia 04/21/2018  . Abnormal transaminases 04/21/2018  . Acute lower UTI 04/21/2018  . UTI (urinary tract infection) 04/21/2018  . Altered awareness, transient 07/21/2014  . Dementia with behavioral disturbance ([place]) 02/10/2014  . Clinical depression 07/04/2011    Orientation RESPIRATION BLADDER Height & Weight     (Disoriented x4)  O2(2L) Incontinent Weight: 157 lb (71.2 kg) Height:  5' (152.4 cm)  BEHAVIORAL SYMPTOMS/MOOD NEUROLOGICAL BOWEL NUTRITION STATUS      Incontinent Diet(See dc summary)  AMBULATORY STATUS COMMUNICATION OF NEEDS Skin   Extensive Assist Non-Verbally Normal                       Personal Care Assistance Level of Assistance  Bathing, Feeding, Dressing Bathing Assistance: Maximum assistance Feeding assistance: Limited assistance Dressing Assistance: Maximum assistance     Functional Limitations Info  Sight, Hearing, Speech Sight Info: Adequate Hearing Info: Adequate Speech Info: Adequate    SPECIAL CARE FACTORS FREQUENCY                       Contractures Contractures Info: Not present    Additional Factors Info  Code Status, Allergies Code Status  Info: DNR Allergies Info: Sulfa Antibiotics, Penicillins           Current Medications (04/24/2018):  This is the current hospital active medication list Current Facility-Administered Medications  Medication Dose Route Frequency Provider Last Rate Last Dose  . 0.9 % NaCl with KCl 20 mEq/ L  infusion   Intravenous Continuous Arta Silence, MD 50 mL/hr at 04/23/18 0548    . antiseptic oral rinse (BIOTENE) solution 15 mL  15 mL Mouth Rinse PRN Arta Silence, MD      . bisacodyl (DULCOLAX) suppository 10 mg  10 mg Rectal Daily PRN Arta Silence, MD      . ciprofloxacin (CIPRO) tablet 500 mg  500 mg Oral BID Nita Sells, MD   500 mg at 04/23/18 1035  . enoxaparin (LOVENOX) injection 40 mg  40 mg Subcutaneous Q24H Arta Silence, MD   40 mg at 04/23/18 1239  . ibuprofen (ADVIL,MOTRIN) tablet 400 mg  400 mg Oral Q8H PRN Arta Silence, MD   400 mg at 04/21/18 4540   Or  . ketorolac (TORADOL) 15 MG/ML injection 15 mg  15 mg Intravenous Q8H PRN Arta Silence, MD      . indomethacin (INDOCIN) 50 MG suppository 100 mg  100 mg Rectal Once Arta Silence, MD      . metroNIDAZOLE (FLAGYL) tablet 500 mg  500 mg Oral Q8H Nita Sells, MD   500 mg at 04/24/18 9811  . mirtazapine (REMERON) tablet 15 mg  15 mg  Oral Michell Heinrich, MD   15 mg at 04/22/18 2127  . ondansetron (ZOFRAN) tablet 4 mg  4 mg Oral Q6H PRN Arta Silence, MD       Or  . ondansetron (ZOFRAN) injection 4 mg  4 mg Intravenous Q6H PRN Arta Silence, MD      . polyethylene glycol (MIRALAX / GLYCOLAX) packet 17 g  17 g Oral Daily Arta Silence, MD   17 g at 04/21/18 1140  . senna (SENOKOT) tablet 8.6 mg  1 tablet Oral BID Arta Silence, MD   8.6 mg at 04/21/18 2239  . sertraline (ZOLOFT) tablet 25 mg  25 mg Oral Michell Heinrich, MD   25 mg at 04/22/18 2127  . sodium chloride flush (NS) 0.9 % injection 3 mL  3 mL Intravenous Once Arta Silence, MD   Stopped at 04/21/18 [phone]     Discharge  Medications: TAKE these medications   BAZA ANTIFUNGAL 2 % cream Generic drug:  miconazole Apply 1 application topically 2 (two) times daily as needed (after diaper change).   CALCIUM 600/VITAMIN D 600-400 MG-UNIT Tabs Generic drug:  Calcium Carbonate-Vitamin D3 Take 1 tablet by mouth 2 (two) times daily.   ciprofloxacin 500 MG tablet Commonly known as:  CIPRO Take 1 tablet (500 mg total) by mouth 2 (two) times daily for 10 days.   metroNIDAZOLE 500 MG tablet Commonly known as:  FLAGYL Take 1 tablet (500 mg total) by mouth every 8 (eight) hours for 10 days.   mirtazapine 15 MG tablet Commonly known as:  REMERON Take 1 tablet (15 mg total) by mouth at bedtime.   sertraline 50 MG tablet Commonly known as:  ZOLOFT Take 0.5 tablets (25 mg total) by mouth at bedtime.   Relevant Imaging Results:  Relevant Lab Results:   Additional Information SSN: [id number]  Servando Snare, LCSW

## 2018-04-24 NOTE — Care Management Important Message (Signed)
Important Message  Patient Details  Name: Wendy Hammond MRN: [id number] Date of Birth: 04/15/1930   Medicare Important Message Given:  Yes    Kerin Salen 04/24/2018, 11:58 [place] Message  Patient Details  Name: Wendy Hammond MRN: [id number] Date of Birth: [birth year]   Medicare Important Message Given:  Yes    Kerin Salen 04/24/2018, 11:58 AM

## 2018-04-24 NOTE — Progress Notes (Signed)
No formal consult.   LCSW received call from floor RN at dc.   Patient is from [facility]. LCSW contacted facility to confirm return.   Facility has to assess patient prior to return. Facility will send someone tomorrow morning to assess patient. Facility feels patient may need a higher level of care.   LCSW sent clinical documents to facility as requested.   LCSW notified attending.   Carolin Coy [place]

## 2018-04-24 NOTE — Discharge Summary (Signed)
Physician Discharge Summary  Wendy Hammond PFX:[id number] DOB: November 12, 1930 DOA: 04/20/2018  PCP: Lajean Manes, MD  Admit date: 04/20/2018 Discharge date: 04/24/2018  Time spent: 25 minutes  Recommendations for Outpatient Follow-up:  1. Will need op labs in about 3-4 days lft and cbc 2. Might require oxygen short term and have this checked as OP 3. Recommend continue usual care and meds a sOP  Discharge Diagnoses:  Principal Problem:   Abnormal transaminases Active Problems:   Dementia with behavioral disturbance (HCC)   Fever   Alzheimer's dementia (HCC)   Hypokalemia   Acute lower UTI   UTI (urinary tract infection)   Discharge Condition: fair  Diet recommendation: soft and graduate to prior consistency in about 1 week  Filed Weights   04/21/18 0201 04/22/18 1020  Weight: 71.2 kg 71.2 kg    History of present illness:  85 year old Caucasian female History of dementia, bipolar, prior UTIs and former smoker Found to have fever and as LFTs were elevated thought was given to urinary tract infection Ultrasound abdomen pelvis showed biliary sludge and gallbladder CT abdomen pelvis showed cholelithiasis bladder wall thickening and pericystic edema large stool in rectum Potassium 3.2 BUN/creatinine 9/0.6 Alk phos 156 yet AST/ALT 293/790  bilirubin 2.9 ammonia 31 Lactic acid 1.3  GI consulted MRCP suspected choledocholithiasis to distal CBD stones up to 5 mm CBD 43m  Hospital Course:  [facility] done as [facility] Transitioned from IV antibiotics to Cipro and Flagyl given she has Enterobacter in blood--given scriopts for 14 days therao Diet as per GI-graduated to and tolerating sof tdiet LFT trended to normal on day of d/c Bipolar Continue Remeron 15 at bedtime sertraline 25 at bedtime Moderate to severe dementia  UTI proteus Cover red with cipro--complete full duration Hypokalemia Replaced with IV fluid 50 cc/h 20 of K k 3.9  on d/c  Procedures: Imaging studies: Impression: - Small periampullary diverticulum, the major  papilla appeared otherwise normal. - A filling defect consistent with a stones were  seen on the cholangiogram. - A biliary sphincterotomy was performed. - Choledocholithiasis was found, and stones were  removed as above. No residual bile duct stones  noted. - The biliary tree was swept. Bile flow good  post-procedure. Bile appeared normal in  color/texture; no purulence was appreciated.  Consultations:  GI  Discharge Exam: Vitals:   04/23/18 2043 04/24/18 0506  BP: 126/80 105/60  Pulse: 73 70  Resp: 14 18  Temp: 99.6 F (37.6 C) 98.4 F (36.9 C)  SpO2: 94% 92%    General: awake alert pleasant in nad non  Verbal but seesm more oherent  Cardiovascular: s1  s2 no m/r/g Respiratory: clear no added sound abd soft nd no rebound no guard--slight tende rin epigastrium  Discharge Instructions   Discharge Instructions    Diet - low sodium heart healthy   Complete by:  As directed    Increase activity slowly   Complete by:  As directed      Allergies as of 04/24/2018      Reactions   Sulfa Antibiotics Anaphylaxis   Penicillins       Medication List    TAKE these medications   BAZA ANTIFUNGAL 2 % cream Generic drug:  miconazole Apply 1 application topically 2 (two) times daily as needed (after diaper change).   CALCIUM 600/VITAMIN D 600-400 MG-UNIT Tabs Generic drug:  Calcium Carbonate-Vitamin D3 Take 1 tablet by mouth 2 (two) times daily.   ciprofloxacin 500 MG tablet  Commonly known as:  CIPRO Take 1 tablet (500 mg total) by mouth 2 (two) times daily for 10 days.   metroNIDAZOLE 500 MG  tablet Commonly known as:  FLAGYL Take 1 tablet (500 mg total) by mouth every 8 (eight) hours for 10 days.   mirtazapine 15 MG tablet Commonly known as:  REMERON Take 1 tablet (15 mg total) by mouth at bedtime.   sertraline 50 MG tablet Commonly known as:  ZOLOFT Take 0.5 tablets (25 mg total) by mouth at bedtime.      Allergies  Allergen Reactions  . Sulfa Antibiotics Anaphylaxis  . Penicillins       The results of significant diagnostics from this hospitalization (including imaging, microbiology, ancillary and laboratory) are listed below for reference.    Significant Diagnostic Studies: Dg Chest 2 View  Result Date: 04/21/2018 CLINICAL DATA:  Fever. Patient is nonverbal. EXAM: CHEST - 2 VIEW COMPARISON:  03/30/2016 FINDINGS: Shallow inspiration with linear atelectasis in the lung bases. Heart size and pulmonary vascularity are normal for technique. No consolidation or airspace disease in the lungs. No blunting of costophrenic angles. No pneumothorax. Mediastinal contours appear intact. Calcification of the aorta. IMPRESSION: Shallow inspiration with linear atelectasis in the lung bases. Electronically Signed   By: Lucienne Capers M.D.   On: 04/21/2018 00:53   Ct Abdomen Pelvis W Contrast  Result Date: 04/21/2018 CLINICAL DATA:  85 y/o  F; abdominal pain and fever. EXAM: CT ABDOMEN AND PELVIS WITH CONTRAST TECHNIQUE: Multidetector CT imaging of the abdomen and pelvis was performed using the standard protocol following bolus administration of intravenous contrast. CONTRAST:  173m ISOVUE-300 IOPAMIDOL (ISOVUE-300) INJECTION 61% COMPARISON:  12/16/2014 CT abdomen and pelvis. FINDINGS: Lower chest: Mild dependent atelectasis of the lungs. Aortic valvular and coronary artery calcific atherosclerosis. Small hiatal hernia. Hepatobiliary: Stable subcentimeter cyst near the liver vascular pedicle. Cholelithiasis. No biliary ductal dilatation. Pancreas: Unremarkable. No pancreatic ductal  dilatation or surrounding inflammatory changes. Spleen: Normal in size without focal abnormality. Adrenals/Urinary Tract: Normal adrenal glands. Multiple renal cysts measuring up to 17 mm rest and lower pole of right kidney. No urinary stone disease or hydronephrosis. Bladder wall thickening pericystic edema. Stomach/Bowel: Stomach is within normal limits. Large volume of stool in the rectum may represent constipation. No evidence of bowel wall thickening, distention, or inflammatory changes. Vascular/Lymphatic: Aortic atherosclerosis. No enlarged abdominal or pelvic lymph nodes. Reproductive: Uterus and bilateral adnexa are unremarkable. Other: No abdominal wall hernia or abnormality. No abdominopelvic ascites. Musculoskeletal: No fracture is seen. IMPRESSION: 1. Bladder wall thickening and pericystic edema, likely cystitis. 2. Large volume of stool in the rectum may represent constipation. 3. Aortic valvular, aortic, and coronary artery calcific atherosclerosis. 4. Small hiatal hernia. 5. Cholelithiasis. Electronically Signed   By: LKristine GarbeM.D.   On: 04/21/2018 04:44   Mr Abdomen Mrcp Wo Contrast  Result Date: 04/21/2018 CLINICAL DATA:  Abnormal LFTs, cholelithiasis EXAM: MRI ABDOMEN WITHOUT CONTRAST  (INCLUDING MRCP) TECHNIQUE: Multiplanar multisequence MR imaging of the abdomen was performed. Heavily T2-weighted images of the biliary and pancreatic ducts were obtained, and three-dimensional MRCP images were rendered by post processing. COMPARISON:  CT abdomen/pelvis dated 04/21/2018 FINDINGS: Motion degraded images. Lower chest: Small bilateral pleural effusions with associated mild dependent atelectasis. Hepatobiliary: Liver is unremarkable.  No hepatic steatosis. Layering gallstones (series 3/image 32), without associated inflammatory changes. No intrahepatic ductal dilatation. Dilated common duct, measuring 12 mm (series 5/image 38). At least two distal CBD stones measuring up to 5 mm  (series  5/image 38). Pancreas: Within normal limits. No peripancreatic fluid/inflammatory changes. Spleen:  Within normal limits. Adrenals/Urinary Tract:  Adrenal glands are within normal limits. Small bilateral renal cysts, measuring up to 1.8 cm in the anterior right lower kidney (series 3/image 40). No hydronephrosis. Stomach/Bowel: Stomach is notable for a small hiatal hernia. Visualized bowel is grossly unremarkable. Vascular/Lymphatic:  No evidence of abdominal aortic aneurysm. No suspicious abdominal lymphadenopathy. Other:  No abdominal ascites. Musculoskeletal: No focal osseous lesions. IMPRESSION: Suspected choledocholithiasis, with two distal CBD stones measuring up to 5 mm. Common duct measures 12 mm. No intrahepatic ductal dilatation. ERCP is suggested. Cholelithiasis, without associated inflammatory changes. Additional ancillary findings as above. Electronically Signed   By: Julian Hy M.D.   On: 04/21/2018 10:13   Mr 3d Recon At Scanner  Result Date: 04/21/2018 CLINICAL DATA:  Abnormal LFTs, cholelithiasis EXAM: MRI ABDOMEN WITHOUT CONTRAST  (INCLUDING MRCP) TECHNIQUE: Multiplanar multisequence MR imaging of the abdomen was performed. Heavily T2-weighted images of the biliary and pancreatic ducts were obtained, and three-dimensional MRCP images were rendered by post processing. COMPARISON:  CT abdomen/pelvis dated 04/21/2018 FINDINGS: Motion degraded images. Lower chest: Small bilateral pleural effusions with associated mild dependent atelectasis. Hepatobiliary: Liver is unremarkable.  No hepatic steatosis. Layering gallstones (series 3/image 32), without associated inflammatory changes. No intrahepatic ductal dilatation. Dilated common duct, measuring 12 mm (series 5/image 38). At least two distal CBD stones measuring up to 5 mm (series 5/image 38). Pancreas: Within normal limits. No peripancreatic fluid/inflammatory changes. Spleen:  Within normal limits. Adrenals/Urinary Tract:  Adrenal  glands are within normal limits. Small bilateral renal cysts, measuring up to 1.8 cm in the anterior right lower kidney (series 3/image 40). No hydronephrosis. Stomach/Bowel: Stomach is notable for a small hiatal hernia. Visualized bowel is grossly unremarkable. Vascular/Lymphatic:  No evidence of abdominal aortic aneurysm. No suspicious abdominal lymphadenopathy. Other:  No abdominal ascites. Musculoskeletal: No focal osseous lesions. IMPRESSION: Suspected choledocholithiasis, with two distal CBD stones measuring up to 5 mm. Common duct measures 12 mm. No intrahepatic ductal dilatation. ERCP is suggested. Cholelithiasis, without associated inflammatory changes. Additional ancillary findings as above. Electronically Signed   By: Julian Hy M.D.   On: 04/21/2018 10:13   US Abdomen Limited  Result Date: 04/21/2018 CLINICAL DATA:  Right upper quadrant pain EXAM: ULTRASOUND ABDOMEN LIMITED RIGHT UPPER QUADRANT COMPARISON:  None. FINDINGS: Gallbladder: Low-level internal echoes within the gallbladder may represent biliary sludge. No sonographic Percell Miller sign was elicited. No wall thickening of the gallbladder nor pericholecystic fluid is identified. Common bile duct: Diameter: 6.7 mm and within normal limits for age. Liver: No focal lesion identified. Within normal limits in parenchymal echogenicity. Portal vein is patent on color Doppler imaging with normal direction of blood flow towards the liver. IMPRESSION: Small amount of biliary sludge is suggested within the gallbladder without secondary signs of acute cholecystitis. Electronically Signed   By: Ashley Royalty M.D.   On: 04/21/2018 03:11   Dg Ercp With Sphincterotomy  Result Date: 04/22/2018 CLINICAL DATA:  Biliary duct stone EXAM: ERCP TECHNIQUE: Multiple spot images obtained with the fluoroscopic device and submitted for interpretation post-procedure. FLUOROSCOPY TIME:  Fluoroscopy Time:  2 minutes and 40 seconds Radiation Exposure Index (if provided by  the fluoroscopic device): 48.49 mGy Number of Acquired Spot Images: 0 COMPARISON:  None. FINDINGS: Images document cannulation of the common bile duct and contrast filling the biliary tree. IMPRESSION: See above. These images were submitted for radiologic interpretation only. Please see the procedural report for the  amount of contrast and the fluoroscopy time utilized. Electronically Signed   By: Marybelle Killings M.D.   On: 04/22/2018 12:16    Microbiology: Recent Results (from the past 240 hour(s))  Culture, blood (Routine x 2)     Status: Abnormal   Collection Time: 04/20/18 11:47 PM  Result Value Ref Range Status   Specimen Description BLOOD RIGHT HAND  Final   Special Requests   Final    BOTTLES DRAWN AEROBIC AND ANAEROBIC Blood Culture adequate volume Performed at [facility], [address] [place], [place] [address]    Culture  Setup Time   Final    GRAM NEGATIVE RODS ANAEROBIC BOTTLE ONLY CRITICAL RESULT CALLED TO, READ BACK BY AND VERIFIED WITH: [place], L 1846 [id number] FCP    Culture ENTEROBACTER CLOACAE (A)  Final   Report Status 04/23/2018 FINAL  Final   Organism ID, Bacteria ENTEROBACTER CLOACAE  Final      Susceptibility   Enterobacter cloacae - MIC*    CEFAZOLIN RESISTANT Resistant     CEFEPIME <=1 SENSITIVE Sensitive     CEFTAZIDIME <=1 SENSITIVE Sensitive     CEFTRIAXONE <=1 SENSITIVE Sensitive     CIPROFLOXACIN <=0.25 SENSITIVE Sensitive     GENTAMICIN <=1 SENSITIVE Sensitive     IMIPENEM <=0.25 SENSITIVE Sensitive     TRIMETH/SULFA <=20 SENSITIVE Sensitive     PIP/TAZO <=4 SENSITIVE Sensitive     * ENTEROBACTER CLOACAE  Blood Culture ID Panel (Reflexed)     Status: Abnormal   Collection Time: 04/20/18 11:47 PM  Result Value Ref Range Status   Enterococcus species NOT DETECTED NOT DETECTED Final   Listeria monocytogenes NOT DETECTED NOT DETECTED Final   Staphylococcus species NOT DETECTED NOT DETECTED Final   Staphylococcus aureus (BCID) NOT  DETECTED NOT DETECTED Final   Streptococcus species NOT DETECTED NOT DETECTED Final   Streptococcus agalactiae NOT DETECTED NOT DETECTED Final   Streptococcus pneumoniae NOT DETECTED NOT DETECTED Final   Streptococcus pyogenes NOT DETECTED NOT DETECTED Final   Acinetobacter baumannii NOT DETECTED NOT DETECTED Final   Enterobacteriaceae species DETECTED (A) NOT DETECTED Final    Comment: Enterobacteriaceae represent a large family of gram-negative bacteria, not a single organism. CRITICAL RESULT CALLED TO, READ BACK BY AND VERIFIED WITH: PHARMD POINDEXTER, L [id number] [id number] FCP    Enterobacter cloacae complex DETECTED (A) NOT DETECTED Final    Comment: CRITICAL RESULT CALLED TO, READ BACK BY AND VERIFIED WITH: PHARMD POINDEXTER, L [phone] FCP    Escherichia coli NOT DETECTED NOT DETECTED Final   Klebsiella oxytoca NOT DETECTED NOT DETECTED Final   Klebsiella pneumoniae NOT DETECTED NOT DETECTED Final   Proteus species NOT DETECTED NOT DETECTED Final   Serratia marcescens NOT DETECTED NOT DETECTED Final   Carbapenem resistance NOT DETECTED NOT DETECTED Final   Haemophilus influenzae NOT DETECTED NOT DETECTED Final   Neisseria meningitidis NOT DETECTED NOT DETECTED Final   Pseudomonas aeruginosa NOT DETECTED NOT DETECTED Final   Candida albicans NOT DETECTED NOT DETECTED Final   Candida glabrata NOT DETECTED NOT DETECTED Final   Candida krusei NOT DETECTED NOT DETECTED Final   Candida parapsilosis NOT DETECTED NOT DETECTED Final   Candida tropicalis NOT DETECTED NOT DETECTED Final  Culture, blood (Routine x 2)     Status: None (Preliminary result)   Collection Time: 04/21/18 12:29 AM  Result Value Ref Range Status   Specimen Description   Final    BLOOD RIGHT HAND Performed at [facility]  [facility], [place] [address], [place], [place] [address]    Special Requests   Final    BOTTLES DRAWN AEROBIC AND ANAEROBIC Blood Culture adequate volume Performed at [place] [address] [place], [place] [address]    Culture   Final    NO GROWTH 3 DAYS Performed at [facility], [place] [address], [place], [place] [address]    Report Status PENDING  Incomplete  Urine culture     Status: Abnormal   Collection Time: 04/21/18  3:39 AM  Result Value Ref Range Status   Specimen Description   Final    URINE, CATHETERIZED Performed at [place] [address], [place], [place] [address]    Special Requests   Final    NONE Performed at [facility], [place] [address], [place], [place] [address]    Culture >=100,000 COLONIES/mL PROTEUS MIRABILIS (A)  Final   Report Status 04/23/2018 FINAL  Final   Organism ID, Bacteria PROTEUS MIRABILIS (A)  Final      Susceptibility   Proteus mirabilis - MIC*    AMPICILLIN <=2 SENSITIVE Sensitive     CEFAZOLIN <=4 SENSITIVE Sensitive     CEFTRIAXONE <=1 SENSITIVE Sensitive     CIPROFLOXACIN <=0.25 SENSITIVE Sensitive     GENTAMICIN <=1 SENSITIVE Sensitive     IMIPENEM 0.5 SENSITIVE Sensitive     NITROFURANTOIN 128 RESISTANT Resistant     TRIMETH/SULFA <=20 SENSITIVE Sensitive     AMPICILLIN/SULBACTAM <=2 SENSITIVE Sensitive     PIP/TAZO <=4 SENSITIVE Sensitive     * >=100,000 COLONIES/mL PROTEUS MIRABILIS  MRSA PCR Screening     Status: None   Collection Time: 04/21/18  7:16 AM  Result Value Ref Range Status   MRSA by PCR NEGATIVE NEGATIVE Final    Comment:        The GeneXpert MRSA Assay (FDA approved for NASAL specimens only), is one component of a comprehensive MRSA colonization surveillance program. It is not intended to diagnose MRSA infection nor to guide or monitor treatment for MRSA infections. Performed at [facility], [place] [address] [place], [place] [address]      Labs: Basic Metabolic Panel: Recent Labs  Lab 04/21/18 0029 04/21/18 1821 04/23/18 0546 04/24/18 0617  NA 142  --  144 141  K 3.2*  --  3.9 3.9  CL 107   --  117* 114*  CO2 25  --  24 22  GLUCOSE 119*  --  100* 91  BUN 25*  --  12 6*  CREATININE 0.54  --  0.53 0.51  CALCIUM 9.0  --  7.9* 7.8*  MG  --  2.2  --   --    Liver Function Tests: Recent Labs  Lab 04/21/18 0029 04/23/18 1058 04/24/18 0617  AST 293* 44* 35  ALT 790* 207* 154*  ALKPHOS 156* 99 91  BILITOT 2.9* 0.8 0.5  PROT 7.0 5.3* 4.8*  ALBUMIN 3.6 2.7* 2.4*   No results for input(s): LIPASE, AMYLASE in the last 168 hours. Recent Labs  Lab 04/21/18 1821  AMMONIA 31   CBC: Recent Labs  Lab 04/21/18 0029 04/23/18 0546 04/24/18 0617  WBC 7.4 5.3 5.7  NEUTROABS 6.4 3.8 3.7  HGB 12.6 10.7* 10.8*  HCT 40.3 35.6* 35.4*  MCV 96.2 97.3 98.6  PLT 133* 89* 114*   Cardiac Enzymes: No results for input(s): CKTOTAL, CKMB, CKMBINDEX, TROPONINI in the last 168 hours. BNP: BNP (  last 3 results) No results for input(s): BNP in the last 8760 hours.  ProBNP (last 3 results) No results for input(s): PROBNP in the last 8760 hours.  CBG: No results for input(s): GLUCAP in the last 168 hours.     Signed:  Nita Sells MD   [facility] 04/24/2018, 1:19 PM

## 2018-04-25 NOTE — Progress Notes (Signed)
Report called to [facility]. Pt is ready for transport.

## 2018-04-25 NOTE — Progress Notes (Signed)
LCSW following for disposition. Patient from [facility]. LCSW received call from floor RN yesterday at dc. Facility required assessment prior to return.   Girard Cooter [phone] from [facility] assessed patient this morning and confirmed return. Clinicals faxed yesterday.   Patient when ready.   RN report number: [phone]  Servando Snare, Shawna Clamp CSW [phone]

## 2018-04-25 NOTE — Progress Notes (Signed)
Progress Note  Late Entry  Patient had been prepared for discharge on 2/4 but could not leave because ALF wanted to assess patient in the hospital prior to accepting her.  I briefly reviewed chart and DC summary from 2/4 done by Dr. Verlon Au.  I attempted to interview patient this morning but she was not saying much but just tracking activity around her with her eyes.  As per RN, no acute issues noted.  Pleasant elderly female, moderately built and nourished lying comfortably supine in bed without distress.  Vitals:   04/24/18 2027 04/25/18 0424  BP: 134/65 (!) 129/58  Pulse: 69 63  Resp: 20 20  Temp: 98.2 F (36.8 C) 98.7 F (37.1 C)  SpO2: 93% 93%   Respiratory system: Clear to auscultation.  No increased work of breathing. CVS: S1 and S2 heard, RRR.  No JVD, murmurs or pedal edema Abdominal exam: Nondistended, soft and nontender.  Normal bowel sounds are. CNS: Alert but unable to assess orientation.  Patient not saying anything but tracks activity around her with her eyes.  No overt focal neurological deficits.  Assessment and plan  1. Choledocholithiasis: Seen by [facility] and status post ERCP, removal of stones and sphincterotomy.  LFTs have almost normalized.  Outpatient follow-up with GI. 2. 1 of 2 blood cultures positive for Enterobacter cloaca: Discharged on Cipro and Flagyl to complete total 14 days course. 3. Proteus UTI: Cipro should cover. 4. Severe dementia: I am not sure of patient's baseline mental status and this may be her baseline. 5. Normocytic anemia: Stable. 6. Thrombocytopenia: Unclear etiology, improving.  Follow CBC at SNF.  Could be related to infection.  Patient evaluated by ALF staff and DC'ed to ALF today.   Vernell Leep, MD, FACP, [facility]. [facility]  To contact the attending provider between 7A-7P or the covering provider during after hours 7P-7A, please log into the web site [email or web address] and access using universal [place] password for  that web site. If you do not have the password, please call the hospital operator.

## 2018-04-26 LAB — CULTURE, BLOOD (ROUTINE X 2)
Culture: NO GROWTH
Special Requests: ADEQUATE

## 2018-06-08 IMAGING — CR DG PELVIS 1-2V
1 series · 1 of 1 positions shown · non-contrast
Comparison: None.

CLINICAL DATA: Status post fall out of bed today.

EXAM:
PELVIS - 1-2 VIEW

[pelvis ap]
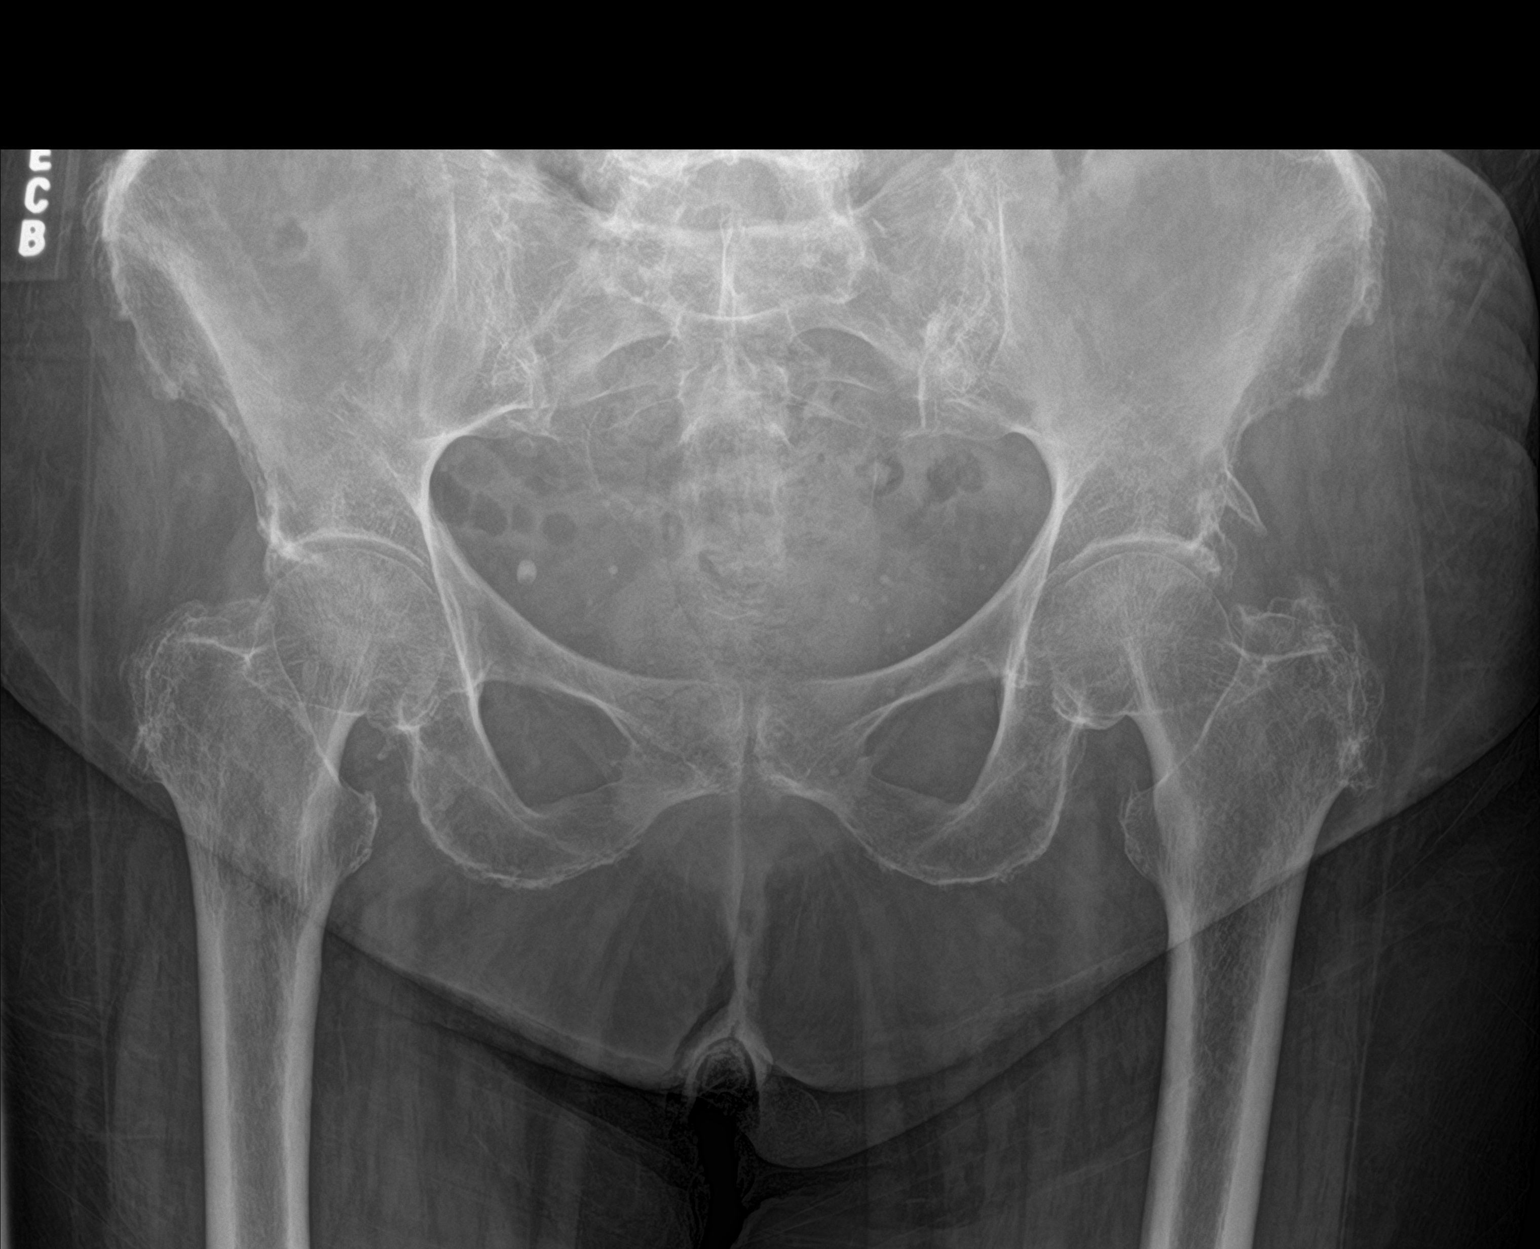

[1 of 1 positions shown; findings below may reference images not displayed]

FINDINGS: There is no evidence of pelvic fracture or diastasis. No pelvic bone
lesions are seen. Degenerative disease about both hips and lower
lumbar spine is noted.
IMPRESSION: No acute abnormality.

## 2018-06-08 IMAGING — CT CT HEAD W/O CM
5 of 10 series · 16 of 47 positions shown, 18 images · non-contrast
Comparison: 03/12/2014 head CT.

CLINICAL DATA: Patient fell out of bed and hit head with laceration
over right eye.

EXAM:
CT HEAD WITHOUT CONTRAST
CT CERVICAL SPINE WITHOUT CONTRAST
TECHNIQUE: Multidetector CT imaging of the head and cervical spine was
performed following the standard protocol without intravenous
contrast. Multiplanar CT image reconstructions of the cervical spine
were also generated.

[Series 3: head 5.0 h30s · axial · 0.43mm/px · z∈[-138,-83]mm · 2 of 33 slices shown]
[im 11/33  brain]
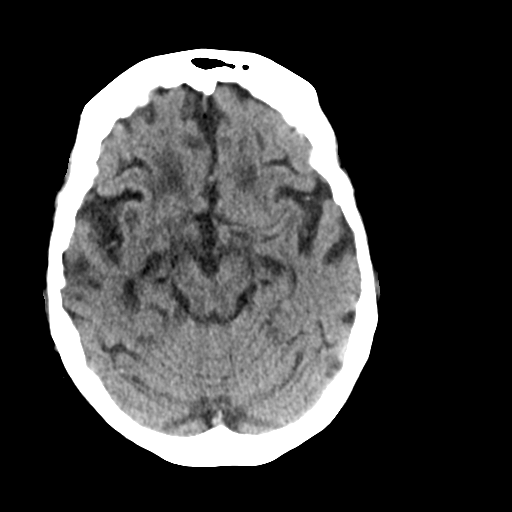
[im 22/33  brain]
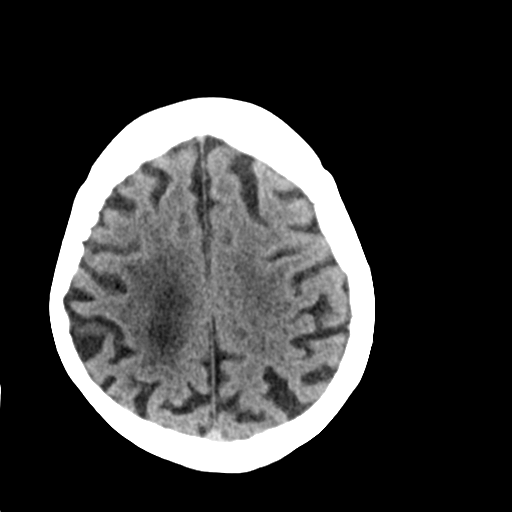

[Series 5: head 3.0 mpr cor · coronal · 0.31mm/px · 2 of 67 slices shown]
[im 16/67  brain]
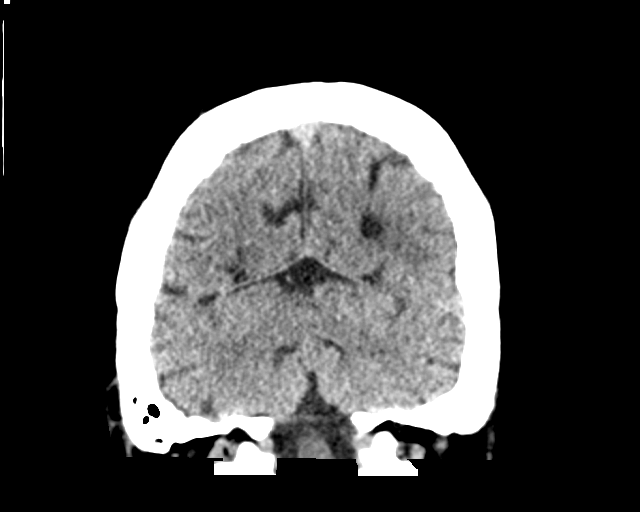
[im 32/67  brain]
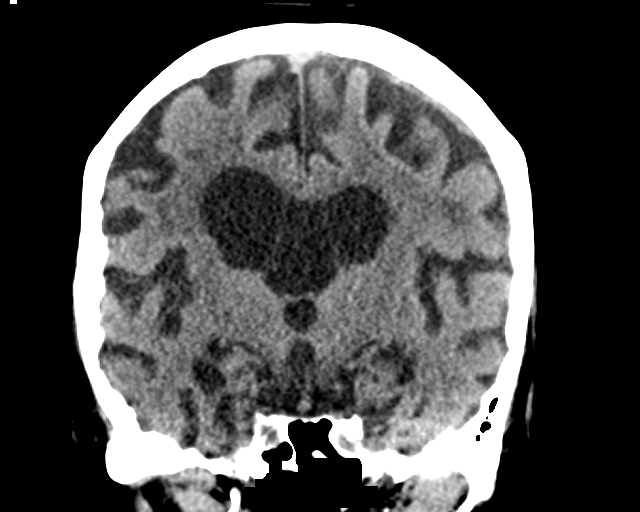

[Series 6: head 3.0 mpr sag · sagittal · 0.33mm/px · 2 of 67 slices shown]
[im 23/67  brain]
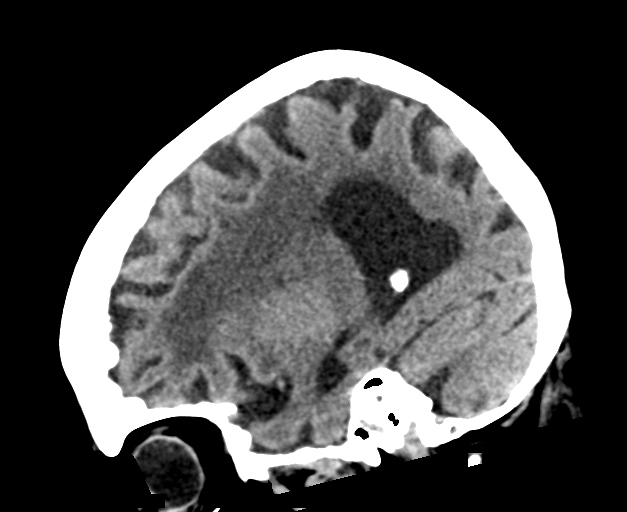
[im 45/67  brain]
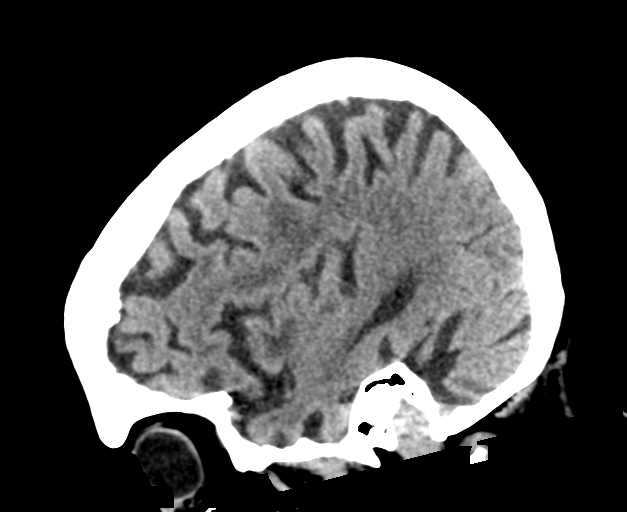

[Series 16: orthogonal bone · axial · 0.21mm/px · z∈[-328,-270]mm · 4 of 89 slices shown]
[im 13/89  bone]
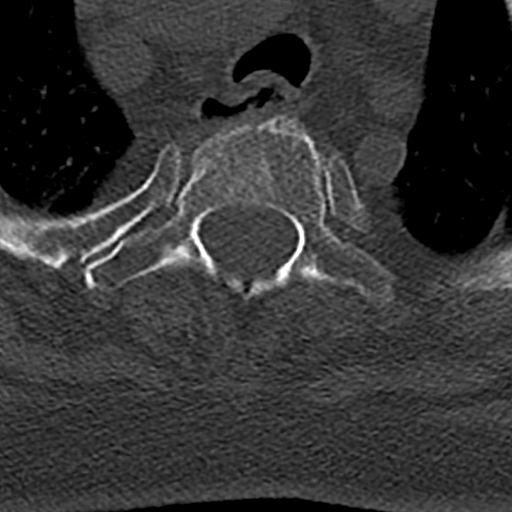
[im 26/89  bone]
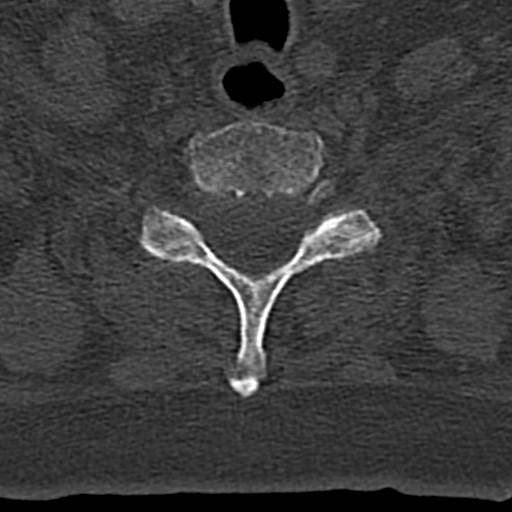
[im 38/89  bone]
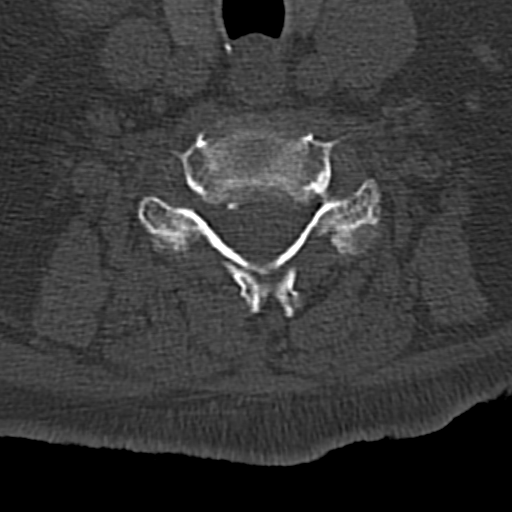
[im 51/89  bone]
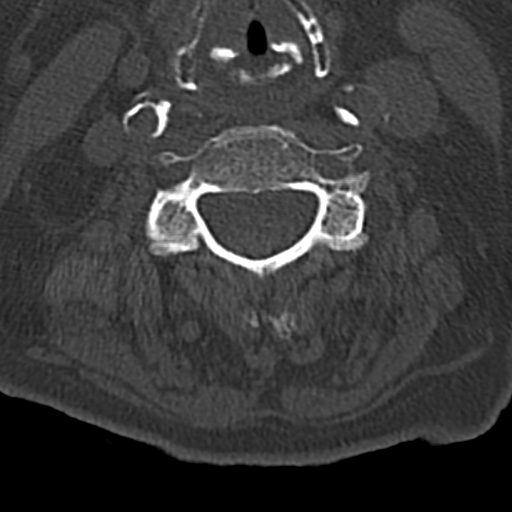

[Series 17: orthogonal st · axial · 0.21mm/px · z∈[-328,-225]mm · 6 of 89 slices shown, 8 images]
[im 13/89  brain]
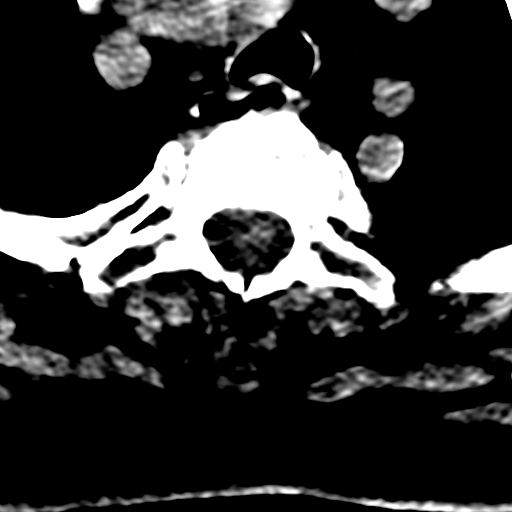
[im 13/89  bone]
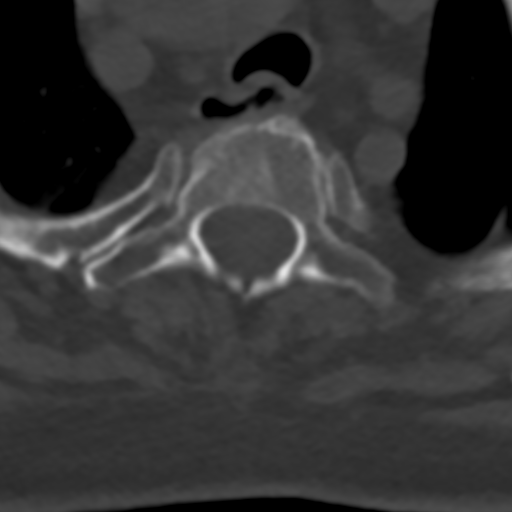
[im 26/89  brain]
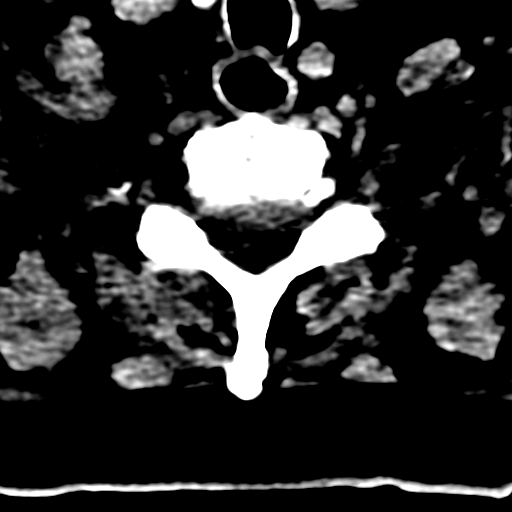
[im 38/89  brain]
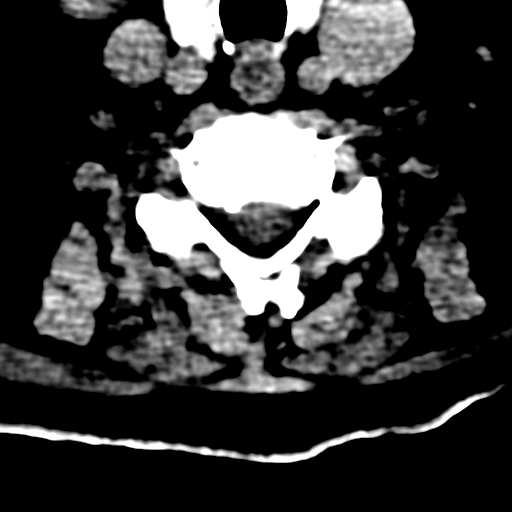
[im 51/89  brain]
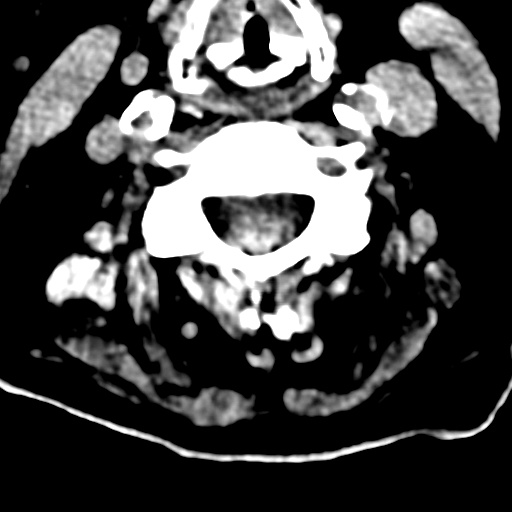
[im 63/89  brain]
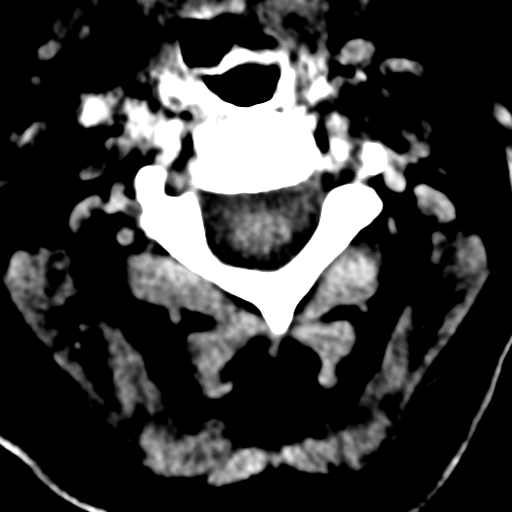
[im 63/89  bone]
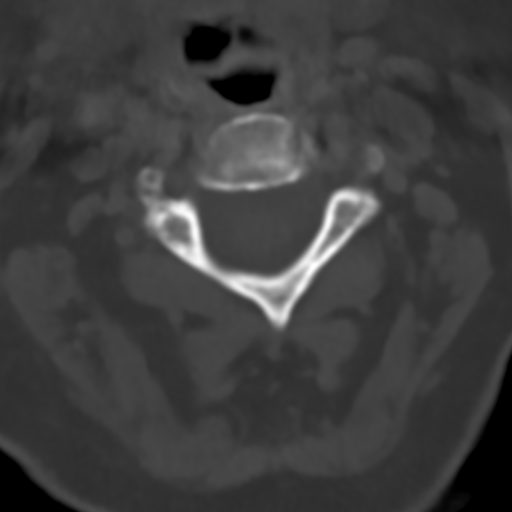
[im 76/89  brain]
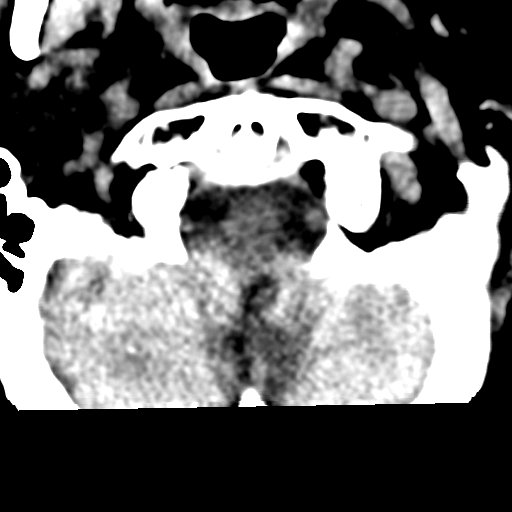

[16 of 47 positions shown; findings below may reference images not displayed]

FINDINGS: CT HEAD FINDINGS

Brain: There is no evidence for acute hemorrhage, hydrocephalus,
mass lesion, or abnormal extra-axial fluid collection. No definite
CT evidence for acute infarction. Diffuse loss of parenchymal volume
is consistent with atrophy. Patchy low attenuation in the deep
hemispheric and periventricular white matter is nonspecific, but
likely reflects chronic microvascular ischemic demyelination.

Vascular: Atherosclerotic calcification is visualized in the carotid
arteries. No dense MCA sign. Major dural sinuses are unremarkable.

Skull: No evidence for fracture. No worrisome lytic or sclerotic
lesion.

Sinuses/Orbits: Visualized portions of the globes and intraorbital
fat are unremarkable. The visualized paranasal sinuses and mastoid
air cells are clear.

Other: None.

CT CERVICAL SPINE FINDINGS

Alignment: Straightening of the normal cervical lordosis is evident.
Cervical facets are well aligned bilaterally.

Skull base and vertebrae: No evidence for acute fracture from the
skullbase to the T1-2 interspace.

Soft tissues and spinal canal: No prevertebral fluid or swelling. No
visible canal hematoma.

Disc levels: Loss of disc height with endplate spurring is seen at
C5-6. Left-sided facet osteoarthritis in is evident at C4-5, C5-6,
and C6-7.

Upper chest: Posterior atelectasis right apex is unchanged since CT
chest of 12/16/2014

Other: None.
IMPRESSION: 1. No acute intracranial abnormality.
2. Atrophy with chronic small vessel white matter ischemic disease.
3. Degenerative changes in the cervical spine without fracture.
4. Loss of cervical lordosis. This can be related to patient
positioning, muscle spasm or soft tissue injury.

## 2020-04-18 ENCOUNTER — Other Ambulatory Visit: Payer: Self-pay

## 2020-04-18 ENCOUNTER — Emergency Department (HOSPITAL_COMMUNITY): Payer: Medicare Other

## 2020-04-18 ENCOUNTER — Emergency Department (HOSPITAL_COMMUNITY)
Admission: EM | Admit: 2020-04-18 | Discharge: 2020-04-18 | Disposition: A | Discharge status: Home / Self Care | Payer: Medicare Other | Attending: Emergency Medicine | Admitting: Emergency Medicine

## 2020-04-18 DIAGNOSIS — Z79899 Other long term (current) drug therapy: Secondary | ICD-10-CM | POA: Insufficient documentation

## 2020-04-18 DIAGNOSIS — R29701 NIHSS score 1: Secondary | ICD-10-CM | POA: Insufficient documentation

## 2020-04-18 DIAGNOSIS — I639 Cerebral infarction, unspecified: Secondary | ICD-10-CM | POA: Diagnosis not present

## 2020-04-18 DIAGNOSIS — G309 Alzheimer's disease, unspecified: Secondary | ICD-10-CM | POA: Diagnosis not present

## 2020-04-18 DIAGNOSIS — Z87891 Personal history of nicotine dependence: Secondary | ICD-10-CM | POA: Diagnosis not present

## 2020-04-18 DIAGNOSIS — F0281 Dementia in other diseases classified elsewhere with behavioral disturbance: Secondary | ICD-10-CM | POA: Diagnosis not present

## 2020-04-18 DIAGNOSIS — I679 Cerebrovascular disease, unspecified: Secondary | ICD-10-CM | POA: Diagnosis present

## 2020-04-18 DIAGNOSIS — G319 Degenerative disease of nervous system, unspecified: Secondary | ICD-10-CM | POA: Diagnosis not present

## 2020-04-18 DIAGNOSIS — R29810 Facial weakness: Secondary | ICD-10-CM | POA: Insufficient documentation

## 2020-04-18 DIAGNOSIS — I672 Cerebral atherosclerosis: Secondary | ICD-10-CM | POA: Insufficient documentation

## 2020-04-18 DIAGNOSIS — Z20822 Contact with and (suspected) exposure to covid-19: Secondary | ICD-10-CM | POA: Diagnosis not present

## 2020-04-18 LAB — COMPREHENSIVE METABOLIC PANEL
ALT: 14 U/L (ref 0–44)
AST: 17 U/L (ref 15–41)
Albumin: 3.8 g/dL (ref 3.5–5.0)
Alkaline Phosphatase: 84 U/L (ref 38–126)
Anion gap: 11 (ref 5–15)
BUN: 14 mg/dL (ref 8–23)
CO2: 24 mmol/L (ref 22–32)
Calcium: 9.1 mg/dL (ref 8.9–10.3)
Chloride: 104 mmol/L (ref 98–111)
Creatinine, Ser: 0.61 mg/dL (ref 0.44–1.00)
GFR, Estimated: >60 mL/min (ref >60)
Glucose, Bld: 108 mg/dL — ABNORMAL HIGH (ref 70–99)
Potassium: 3.7 mmol/L (ref 3.5–5.1)
Sodium: 139 mmol/L (ref 135–145)
Total Bilirubin: 0.6 mg/dL (ref 0.3–1.2)
Total Protein: 6.8 g/dL (ref 6.5–8.1)

## 2020-04-18 LAB — LIPID PANEL
Cholesterol: 312 mg/dL — ABNORMAL HIGH (ref 0–200)
HDL: 39 mg/dL — ABNORMAL LOW (ref >40)
LDL Cholesterol: 238 mg/dL — ABNORMAL HIGH (ref 0–99)
Total CHOL/HDL Ratio: 8 RATIO
Triglycerides: 176 mg/dL — ABNORMAL HIGH (ref <150)
VLDL: 35 mg/dL (ref 0–40)

## 2020-04-18 LAB — I-STAT CHEM 8, ED
BUN: 16 mg/dL (ref 8–23)
Calcium, Ion: 1.12 mmol/L — ABNORMAL LOW (ref 1.15–1.40)
Chloride: 108 mmol/L (ref 98–111)
Creatinine, Ser: 0.6 mg/dL (ref 0.44–1.00)
Glucose, Bld: 102 mg/dL — ABNORMAL HIGH (ref 70–99)
HCT: 40 % (ref 36.0–46.0)
Hemoglobin: 13.6 g/dL (ref 12.0–15.0)
Potassium: 3.7 mmol/L (ref 3.5–5.1)
Sodium: 140 mmol/L (ref 135–145)
TCO2: 23 mmol/L (ref 22–32)

## 2020-04-18 LAB — CBC
HCT: 43.4 % (ref 36.0–46.0)
Hemoglobin: 13.7 g/dL (ref 12.0–15.0)
MCH: 29.7 pg (ref 26.0–34.0)
MCHC: 31.6 g/dL (ref 30.0–36.0)
MCV: 93.9 fL (ref 80.0–100.0)
Platelets: 182 10*3/uL (ref 150–400)
RBC: 4.62 MIL/uL (ref 3.87–5.11)
RDW: 13.5 % (ref 11.5–15.5)
WBC: 6.5 10*3/uL (ref 4.0–10.5)
nRBC: 0 % (ref 0.0–0.2)

## 2020-04-18 LAB — CBG MONITORING, ED: Glucose-Capillary: 98 mg/dL (ref 70–99)

## 2020-04-18 LAB — HEMOGLOBIN A1C
Hgb A1c MFr Bld: 5.4 % (ref 4.8–5.6)
Mean Plasma Glucose: 108.28 mg/dL

## 2020-04-18 LAB — DIFFERENTIAL
Abs Immature Granulocytes: 0.02 10*3/uL (ref 0.00–0.07)
Basophils Absolute: 0.1 10*3/uL (ref 0.0–0.1)
Basophils Relative: 1 %
Eosinophils Absolute: 0.1 10*3/uL (ref 0.0–0.5)
Eosinophils Relative: 2 %
Immature Granulocytes: 0 %
Lymphocytes Relative: 29 %
Lymphs Abs: 1.9 10*3/uL (ref 0.7–4.0)
Monocytes Absolute: 0.6 10*3/uL (ref 0.1–1.0)
Monocytes Relative: 9 %
Neutro Abs: 3.8 10*3/uL (ref 1.7–7.7)
Neutrophils Relative %: 59 %

## 2020-04-18 LAB — PROTIME-INR
INR: 1.1 (ref 0.8–1.2)
Prothrombin Time: 13.4 seconds (ref 11.4–15.2)

## 2020-04-18 LAB — APTT: aPTT: 25 seconds (ref 24–36)

## 2020-04-18 LAB — SARS CORONAVIRUS 2 (TAT 6-24 HRS): SARS Coronavirus 2: NEGATIVE

## 2020-04-18 MED ORDER — ASPIRIN 325 MG PO TABS: 325.0000 mg | ORAL_TABLET | Freq: Once | ORAL | Status: DC

## 2020-04-18 MED ORDER — CLOPIDOGREL BISULFATE 300 MG PO TABS: 300.0000 mg | ORAL_TABLET | Freq: Every day | ORAL | Status: DC

## 2020-04-18 MED ORDER — ASPIRIN 81 MG PO CHEW: 81.0000 mg | CHEWABLE_TABLET | Freq: Every day | ORAL | 0 refills | Status: AC

## 2020-04-18 MED ORDER — ASPIRIN 81 MG PO CHEW: 81.0000 mg | CHEWABLE_TABLET | Freq: Every day | ORAL | Status: DC

## 2020-04-18 MED ORDER — CLOPIDOGREL BISULFATE 75 MG PO TABS: 75.0000 mg | ORAL_TABLET | Freq: Every day | ORAL | Status: DC

## 2020-04-18 MED ORDER — ATORVASTATIN CALCIUM 40 MG PO TABS: 40.0000 mg | ORAL_TABLET | Freq: Every day | ORAL | Status: DC

## 2020-04-18 MED ORDER — IOHEXOL 350 MG/ML SOLN
60.0000 mL | Freq: Once | INTRAVENOUS | Status: AC | PRN
  Administered 2020-04-18: 60 mL via INTRAVENOUS

## 2020-04-18 MED ORDER — SODIUM CHLORIDE 0.9% FLUSH: 3.0000 mL | Freq: Once | INTRAVENOUS | Status: DC

## 2020-04-18 MED ORDER — ATORVASTATIN CALCIUM 40 MG PO TABS: 40.0000 mg | ORAL_TABLET | Freq: Every day | ORAL | 0 refills | Status: AC

## 2020-04-18 MED ORDER — CLOPIDOGREL BISULFATE 75 MG PO TABS: 75.0000 mg | ORAL_TABLET | Freq: Every day | ORAL | 0 refills | Status: AC

## 2020-04-18 NOTE — ED Notes (Signed)
Patient transported to MRI 

## 2020-04-18 NOTE — ED Notes (Signed)
The pt was not passed on the swallow screen here  Unable to give meds prdered.

## 2020-04-18 NOTE — Discharge Instructions (Addendum)
You were seen in the emergency department for an unresponsive episode and possible strokelike symptoms.  You had blood work CAT scan and an MRI brain that shows a possible stroke.  Neurology and your family had prolonged discussions and the decision was made for you to return back to your facility.  Neurology is recommending aspirin daily along with cholesterol medication and Plavix.  Patient's CODE STATUS was DNR.  Recommend further palliative discussions at your facility.

## 2020-04-18 NOTE — ED Triage Notes (Signed)
Pt BIBA from [facility] care unit after going unresponsive for a couple of minutes and drooling. LKW 11am when staff got the patient dressed. Pt has a left sided facial droop with neglect. Pt did not lose bladder or bowel. Pt has hx of Alzheimer and is non verbal and does not follow commands.

## 2020-04-18 NOTE — Consult Note (Addendum)
Neurology Consultation Reason for Consult: left facial droop following 5 minutes of unresponsiveness    Requesting Physician: Dr. Melina Copa  CC: Right facial droop following witnessed 5 minute episode of unresonsiveness  History is obtained from:  EMS, chart review, family at bedside  HPI: Wendy Hammond is a 87 y.o. female with a medical history significant for Alzheimer's Dementia, depression, and anxiety who [facility] from her skilled nursing facility after a witnessed approximate 5 minute episode of unresponsiveness with left facial drooping and drooling that occurred around 11:00 today.  Per son this was perhaps preceded by her gasping a bit of a having difficulty breathing.  On arrival, she was noted to have left facial droop and a right gaze preference.     At baseline Wendy Hammond is nonverbal, requires total care, does not follow commands but is usually alert. During further assessment, family states that the right gaze deviation is baseline for patient as well and her acute deficit is only new facial droop.   Both Wendy Hammond and her husband live together in a dementia care unit; son and daughter report they have had limited visitation secondary to COVID-19 pandemic in the last year.  LKW: 04/18/2020 at 11:00 tPA given?: No, too mild to treat (new deficits represent NIH of 1, non disabling symptom of facial droop) IA performed?: No, no LVO identified on examination or imaging, not a candidate due to MRS Premorbid modified rankin scale: total care in skilled nursing facility.     5 - Severe disability. Requires constant nursing care and attention, bedridden, incontinent.  ROS: Unable to obtain due to altered mental status.   Past Medical History:  Diagnosis Date  . Alzheimer's dementia ([place])   . Anorexia   . Anxiety   . Depression    No family history on file.  Social History:  reports that she quit smoking about 63 years ago. She has never used smokeless tobacco. She  reports that she does not drink alcohol and does not use drugs.  Exam: Current vital signs: BP (!) 116/52   Pulse 66   Resp 15   SpO2 96%  Vital signs in last 24 hours: Pulse Rate:  [66] 66 (01/29 1156) Resp:  [15] 15 (01/29 1156) BP: (116)/(52) 116/52 (01/29 1156) SpO2:  [96 %] 96 % (01/29 1156)   Physical Exam  Constitutional: Appears well-developed and well-nourished.  Psych: Minimally interactive Eyes: No scleral injection HENT: Fair dentition, no oropharyngeal obstruction MSK: no joint deformities.  Cardiovascular: Normal rate and regular rhythm.  Respiratory: Effort normal, non-labored breathing GI: Soft.  No distension. There is no tenderness.  Skin: Warm dry and intact visible skin  Neuro: Mental Status: Initially patient was quite sleepy.  On later evaluation she was alert, tracked examiner briefly, did not follow any commands, sometimes oriented to voice, was non-verbal (baseline) Cranial Nerves: II: Unreliable blink to threat.  Pupils are equal, round, and reactive to light.  III,IV, VI: Initial right gaze preference with right turn which family reported was baseline, but she was more midline on later evaluation V: Facial sensation is symmetric to light touch based on her facial expressions VII: Facial movement with a moderate left facial droop which improved.  VIII: hearing is intact to voice Motor: Increased tone/paratonia throughout.  No focal weakness appreciated with a very limited examination given her ability to participate Sensory: Equally reactive to tickle in all extremities Deep Tendon Reflexes: 3+ and symmetric in the biceps and patellae.  Plantars: Toes  are upgoing bilaterally.  Cerebellar: Unable to assess secondary to patient's mental status   NIHSS total 1 in terms of new deficits (facial droop)  I have reviewed labs in epic and the results pertinent to this consultation are: CBC    Component Value Date/Time   WBC 6.5 04/18/2020 1154    RBC 4.62 04/18/2020 1154   HGB 13.6 04/18/2020 1155   HCT 40.0 04/18/2020 1155   PLT 182 04/18/2020 1154   MCV 93.9 04/18/2020 1154   MCH 29.7 04/18/2020 1154   MCHC 31.6 04/18/2020 1154   RDW 13.5 04/18/2020 1154   LYMPHSABS 1.9 04/18/2020 1154   MONOABS 0.6 04/18/2020 1154   EOSABS 0.1 04/18/2020 1154   BASOSABS 0.1 04/18/2020 1154   CMP     Component Value Date/Time   NA 140 04/18/2020 1155   K 3.7 04/18/2020 1155   CL 108 04/18/2020 1155   CO2 24 04/18/2020 1154   GLUCOSE 102 (H) 04/18/2020 1155   BUN 16 04/18/2020 1155   CREATININE 0.60 04/18/2020 1155   CALCIUM 9.1 04/18/2020 1154   PROT 6.8 04/18/2020 1154   ALBUMIN 3.8 04/18/2020 1154   AST 17 04/18/2020 1154   ALT 14 04/18/2020 1154   ALKPHOS 84 04/18/2020 1154   BILITOT 0.6 04/18/2020 1154   GFRNONAA >60 04/18/2020 1154   GFRAA >60 04/24/2018 0617   Lab Results  Component Value Date   CHOL 312 (H) 04/18/2020   HDL 39 (L) 04/18/2020   LDLCALC 238 (H) 04/18/2020   TRIG 176 (H) 04/18/2020   CHOLHDL 8.0 04/18/2020   Lab Results  Component Value Date   HGBA1C 5.4 04/18/2020   I have reviewed the images obtained: CT head IMPRESSION: 1. No acute abnormality 2. ASPECTS is 10 3. Extensive atrophy with chronic microvascular ischemic change in the white matter 4. Question hyperdense right MCA versus atherosclerotic plaque.  CT angio head and neck IMPRESSION: 1. Atherosclerotic disease in the carotid bifurcation bilaterally. Less than 50% diameter stenosis proximal right internal carotid artery. Left carotid patent without significant stenosis 2. Both vertebral arteries patent without significant stenosis 3. Severe intracranial atherosclerotic disease. Severe stenosis right M1 segment. Moderate stenosis right MCA bifurcation and right M2 branches. Probable occlusion of M3 branch to the right parietal lobe. 4. Moderate atherosclerotic disease left M1 M2 branches. Moderate disease in the anterior cerebral arteries  bilaterally. 5. Pulmonary artery enlargement. Possible mild chronic thrombus in the main pulmonary artery on the left.  MRI brain IMPRESSION: Negative for acute infarct. Extensive atrophy with ventricular enlargement. Extensive chronic microvascular ischemic change throughout the white matter, right greater than left. After further review and discussion with Dr. Curly [place], there is a small 3 mm hyperintensity in the right posterior pons axial diffusion image 63. Possible ADC correlate. This could be an area of recent infarct however could also be chronic. Multiple additional areas of mildly increased signal on ADC in the pons bilaterally. Many of these are hyperintense on FLAIR and may be due to chronic ischemia.  Impression: 87 year old female with history of Alzheimer's Dementia, depression, and anxiety who presents to [facility] 04/18/2020 after a witnessed episode of unresponsiveness that lasted approximately 5 minutes followed by reported new onset of left facial droop and right gaze palsy. On arrival to the ED, a CT was obtained without acute abnormality, CT angio head and neck identified atherosclerotic disease in the carotid bifurcation and severe intracranial atherosclerotic disease, and an MRI was obtained without evidence of acute infarct but demonstrates extensive cerebral  atrophy.   Goals of care discussions with family were somewhat complex.  They reported that all of her outpatient medications have been slowly reduced to the essentials (mirtazapine and an SSRI) meant for her comfort.  Initially they thought that her CODE STATUS was full code although on further review they realize she was actually previously a DNR.  We had a lengthy discussion about the decline was patients with advanced dementia experience when hospitalized (son in person and daughter via phone).  Ultimately they decided that at this time hospitalization would not be within the patient's goals of care.  We discussed that  seizure could be a potential etiology of her symptoms but they would prefer to pursue outpatient EEG if she has additional episodes.  Given the a possible pontine stroke and her risk factor of severe intracranial atherosclerosis on imaging, they did want to start aspirin and Plavix at this time as well as atorvastatin, though they will discuss the likely benefit of these medications given the life expectancy of her mother with their outpatient primary care physician as well as potentially palliative care.  Recommendations: -Aspirin 325 mg once then aspirin 81 mg daily -Plavix 300 mg once then 75 mg daily for 21 days -Atorvastatin 40 mg nightly -Outpatient goals of care discussion with PCP/palliative care -Outpatient EEG should patient have recurrent stereotyped symptoms of altered mental status  Lesleigh Noe MD-PhD [facility] [phone]

## 2020-04-18 NOTE — ED Notes (Signed)
Report called to [facility] at the [facility]  Phone [phone] son remains at  The bedside  ptar here ready to transport

## 2020-04-18 NOTE — ED Notes (Signed)
Pure wick has been placed. Suction set to 45mmHg.  

## 2020-04-18 NOTE — ED Notes (Signed)
Pt to MRI with rapid response RN.

## 2020-04-18 NOTE — ED Provider Notes (Signed)
[facility]ERGENCY DEPARTMENT Provider Note   CSN: [id number] Arrival date & time: 04/18/20  1145     History No chief complaint on file.   Wendy Hammond is a 87 y.o. female.  She is presenting as a code stroke activation.  Level 5 caveat secondary to dementia.  87 year old female with dementia who lives at a nursing facility.  Staff said last known baseline was 11 AM.  At that point they noticed she had left facial droop. Had an unresponsive episode for a few minutes. Some drooling. No seizure activity. Baseline patient is nonverbal.  No blood thinners  The history is provided by the patient and the EMS personnel.  Cerebrovascular Accident This is a new problem. The current episode started less than 1 hour ago. The problem has not changed since onset.Nothing aggravates the symptoms. Nothing relieves the symptoms. She has tried nothing for the symptoms. The treatment provided no relief.       Past Medical History:  Diagnosis Date  . Alzheimer's dementia ([place])   . Anorexia   . Anxiety   . Depression     Patient Active Problem List   Diagnosis Date Noted  . Fever 04/21/2018  . Alzheimer's dementia ([place]) 04/21/2018  . Hypokalemia 04/21/2018  . Abnormal transaminases 04/21/2018  . Acute lower UTI 04/21/2018  . UTI (urinary tract infection) 04/21/2018  . Altered awareness, transient 07/21/2014  . Dementia with behavioral disturbance ([place]) 02/10/2014  . Clinical depression 07/04/2011    Past Surgical History:  Procedure Laterality Date  . APPENDECTOMY    . ERCP N/A 04/22/2018   Procedure: ENDOSCOPIC RETROGRADE CHOLANGIOPANCREATOGRAPHY (ERCP);  Surgeon: Arta Silence, MD;  Location: [facility];  Service: Endoscopy;  Laterality: N/A;  . EYE SURGERY    . REMOVAL OF STONES  04/22/2018   Procedure: REMOVAL OF STONES;  Surgeon: Arta Silence, MD;  Location: [facility];  Service: Endoscopy;;  . SPHINCTEROTOMY  04/22/2018   Procedure: SPHINCTEROTOMY;   Surgeon: Arta Silence, MD;  Location: [facility];  Service: Endoscopy;;     OB History   No obstetric history on file.     No family history on file.  Social History   Tobacco Use  . Smoking status: Former Smoker    Quit date: 03/21/1957    Years since quitting: 63.1  . Smokeless tobacco: Never Used  Vaping Use  . Vaping Use: Never used  Substance Use Topics  . Alcohol use: No    Alcohol/week: 0.0 standard drinks  . Drug use: No    Home Medications Prior to Admission medications   Medication Sig Start Date End Date Taking? Authorizing Provider  Calcium Carbonate-Vitamin D3 (CALCIUM 600/VITAMIN D) 600-400 MG-UNIT TABS Take 1 tablet by mouth 2 (two) times daily. 09/12/11   [provider]  miconazole (BAZA ANTIFUNGAL) 2 % cream Apply 1 application topically 2 (two) times daily as needed (after diaper change).    [provider]  mirtazapine (REMERON) 15 MG tablet Take 1 tablet (15 mg total) by mouth at bedtime. 04/24/18   Nita Sells, MD  sertraline (ZOLOFT) 50 MG tablet Take 0.5 tablets (25 mg total) by mouth at bedtime. 04/24/18   Nita Sells, MD    Allergies    Sulfa antibiotics and Penicillins  Review of Systems   Review of Systems  Unable to perform ROS: Dementia    Physical Exam Updated Vital Signs BP 125/69   Pulse 61   Temp (!) 97.5 F (36.4 C) (Oral)   Resp Marland Kitchen)  9   SpO2 99%   Physical Exam Vitals and nursing note reviewed.  Constitutional:      General: She is not in acute distress.    Appearance: Normal appearance. She is well-developed and well-nourished.  HENT:     Head: Normocephalic and atraumatic.  Eyes:     Conjunctiva/sclera: Conjunctivae normal.  Cardiovascular:     Rate and Rhythm: Normal rate and regular rhythm.     Heart sounds: No murmur heard.   Pulmonary:     Effort: Pulmonary effort is normal. No respiratory distress.     Breath sounds: Normal breath sounds.  Abdominal:     Palpations:  Abdomen is soft.     Tenderness: There is no abdominal tenderness.  Musculoskeletal:        General: No deformity, signs of injury or edema. Normal range of motion.     Cervical back: Neck supple.  Skin:    General: Skin is warm and dry.  Neurological:     Mental Status: She is alert.     Comments: She has baseline is nonverbal.  She is not following any commands.  She has a left facial droop.  Unable to determine if any slurred speech.  Appears to have a right gaze preference.  Not following commands with motor exam but she seems to have normal tone in upper and lower extremities bilaterally.  Psychiatric:        Mood and Affect: Mood and affect normal.     ED Results / Procedures / Treatments   Labs (all labs ordered are listed, but only abnormal results are displayed) Labs Reviewed  COMPREHENSIVE METABOLIC PANEL - Abnormal; Notable for the following components:      Result Value   Glucose, Bld 108 (*)    All other components within normal limits  LIPID PANEL - Abnormal; Notable for the following components:   Cholesterol 312 (*)    Triglycerides 176 (*)    HDL 39 (*)    LDL Cholesterol 238 (*)    All other components within normal limits  I-STAT CHEM 8, ED - Abnormal; Notable for the following components:   Glucose, Bld 102 (*)    Calcium, Ion 1.12 (*)    All other components within normal limits  SARS CORONAVIRUS 2 (TAT 6-24 HRS)  PROTIME-INR  APTT  CBC  DIFFERENTIAL  HEMOGLOBIN A1C  URINALYSIS, ROUTINE W REFLEX MICROSCOPIC  CBG MONITORING, ED    EKG EKG Interpretation  Date/Time:  Saturday April 18 2020 12:22:39 EST Ventricular Rate:  68 PR Interval:    QRS Duration: 114 QT Interval:  430 QTC Calculation: 458 R Axis:   27 Text Interpretation: Probable sinus with artifact Borderline intraventricular conduction delay Abnormal R-wave progression, early transition Confirmed by Aletta Edouard [phone]) on 04/18/2020 12:31:01 PM   Radiology CT Code Stroke CTA  Head W/WO contrast  Result Date: 04/18/2020 CLINICAL DATA:  Acute neurologic deficit.  Left facial droop. EXAM: CT ANGIOGRAPHY HEAD AND NECK TECHNIQUE: Multidetector CT imaging of the head and neck was performed using the standard protocol during bolus administration of intravenous contrast. Multiplanar CT image reconstructions and MIPs were obtained to evaluate the vascular anatomy. Carotid stenosis measurements (when applicable) are obtained utilizing NASCET criteria, using the distal internal carotid diameter as the denominator. CONTRAST:  5mL OMNIPAQUE IOHEXOL 350 MG/ML SOLN COMPARISON:  CT head 04/18/2020 FINDINGS: CTA NECK FINDINGS Aortic arch: Mild atherosclerotic calcification aortic arch and proximal great vessels. Proximal great vessels widely patent. Right carotid system:  Mild atherosclerotic disease right carotid bifurcation. Less than 50% diameter stenosis proximal right internal carotid artery. Moderate stenosis origin right external carotid artery. Left carotid system: Atherosclerotic calcification left carotid bifurcation without significant stenosis. Vertebral arteries: Left carotid dominant and patent to the basilar without stenosis. Small right vertebral artery ends in PICA. Skeleton: Cervical spondylosis without acute skeletal abnormality. Other neck: 12 mm right thyroid nodule. 9 mm left thyroid nodule. No further imaging necessary based on nodule size. No enlarged lymph nodes in the neck. Upper chest: Lung apices clear bilaterally. There is enlargement of the pulmonary arteries bilaterally. There is mild mural thickening of the left main pulmonary artery which could be due to chronic pulmonary embolism. Review of the MIP images confirms the above findings CTA HEAD FINDINGS Anterior circulation: Mild atherosclerotic calcification in the cavernous carotid bilaterally without stenosis. Severe stenosis right M1 segment. Moderate stenosis in the right MCA bifurcation and right M2 branches. There  is a parietal branch on the right which shows loss of perfusion on image 18 series 10. This may be a distal M3 occlusion on the right. Moderate atherosclerotic disease left M1 and M2 branches. Moderate disease in the anterior cerebral arteries A2 segment bilaterally. Hypoplastic or occluded right A1 segment. Posterior circulation: Left vertebral artery supplies the basilar. Right vertebral artery ends in PICA. PICA patent bilaterally. Basilar widely patent. Diffuse atherosclerotic disease in the posterior cerebral arteries with mild to moderate stenosis. No large vessel occlusion. Venous sinuses: Limited venous contrast due to arterial phase scanning Anatomic variants: None Review of the MIP images confirms the above findings IMPRESSION: 1. Atherosclerotic disease in the carotid bifurcation bilaterally. Less than 50% diameter stenosis proximal right internal carotid artery. Left carotid patent without significant stenosis 2. Both vertebral arteries patent without significant stenosis 3. Severe intracranial atherosclerotic disease. Severe stenosis right M1 segment. Moderate stenosis right MCA bifurcation and right M2 branches. Probable occlusion of M3 branch to the right parietal lobe. 4. Moderate atherosclerotic disease left M1 M2 branches. Moderate disease in the anterior cerebral arteries bilaterally. 5. These results were called by telephone at the time of interpretation on 04/18/2020 at 12:25 pm to provider [facility] , who verbally acknowledged these results. 6. Pulmonary artery enlargement. Possible mild chronic thrombus in the main pulmonary artery on the left. Electronically Signed   By: Franchot Gallo M.D.   On: 04/18/2020 12:26   CT Code Stroke CTA Neck W/WO contrast  Result Date: 04/18/2020 CLINICAL DATA:  Acute neurologic deficit.  Left facial droop. EXAM: CT ANGIOGRAPHY HEAD AND NECK TECHNIQUE: Multidetector CT imaging of the head and neck was performed using the standard protocol during bolus  administration of intravenous contrast. Multiplanar CT image reconstructions and MIPs were obtained to evaluate the vascular anatomy. Carotid stenosis measurements (when applicable) are obtained utilizing NASCET criteria, using the distal internal carotid diameter as the denominator. CONTRAST:  29mL OMNIPAQUE IOHEXOL 350 MG/ML SOLN COMPARISON:  CT head 04/18/2020 FINDINGS: CTA NECK FINDINGS Aortic arch: Mild atherosclerotic calcification aortic arch and proximal great vessels. Proximal great vessels widely patent. Right carotid system: Mild atherosclerotic disease right carotid bifurcation. Less than 50% diameter stenosis proximal right internal carotid artery. Moderate stenosis origin right external carotid artery. Left carotid system: Atherosclerotic calcification left carotid bifurcation without significant stenosis. Vertebral arteries: Left carotid dominant and patent to the basilar without stenosis. Small right vertebral artery ends in PICA. Skeleton: Cervical spondylosis without acute skeletal abnormality. Other neck: 12 mm right thyroid nodule. 9 mm left thyroid nodule. No further  imaging necessary based on nodule size. No enlarged lymph nodes in the neck. Upper chest: Lung apices clear bilaterally. There is enlargement of the pulmonary arteries bilaterally. There is mild mural thickening of the left main pulmonary artery which could be due to chronic pulmonary embolism. Review of the MIP images confirms the above findings CTA HEAD FINDINGS Anterior circulation: Mild atherosclerotic calcification in the cavernous carotid bilaterally without stenosis. Severe stenosis right M1 segment. Moderate stenosis in the right MCA bifurcation and right M2 branches. There is a parietal branch on the right which shows loss of perfusion on image 18 series 10. This may be a distal M3 occlusion on the right. Moderate atherosclerotic disease left M1 and M2 branches. Moderate disease in the anterior cerebral arteries A2 segment  bilaterally. Hypoplastic or occluded right A1 segment. Posterior circulation: Left vertebral artery supplies the basilar. Right vertebral artery ends in PICA. PICA patent bilaterally. Basilar widely patent. Diffuse atherosclerotic disease in the posterior cerebral arteries with mild to moderate stenosis. No large vessel occlusion. Venous sinuses: Limited venous contrast due to arterial phase scanning Anatomic variants: None Review of the MIP images confirms the above findings IMPRESSION: 1. Atherosclerotic disease in the carotid bifurcation bilaterally. Less than 50% diameter stenosis proximal right internal carotid artery. Left carotid patent without significant stenosis 2. Both vertebral arteries patent without significant stenosis 3. Severe intracranial atherosclerotic disease. Severe stenosis right M1 segment. Moderate stenosis right MCA bifurcation and right M2 branches. Probable occlusion of M3 branch to the right parietal lobe. 4. Moderate atherosclerotic disease left M1 M2 branches. Moderate disease in the anterior cerebral arteries bilaterally. 5. These results were called by telephone at the time of interpretation on 04/18/2020 at 12:25 pm to [facility] , who verbally acknowledged these results. 6. Pulmonary artery enlargement. Possible mild chronic thrombus in the main pulmonary artery on the left. Electronically Signed   By: Marlan Palauharles  Clark M.D.   On: 04/18/2020 12:26   MR BRAIN WO CONTRAST  Addendum Date: 04/18/2020   ADDENDUM REPORT: 04/18/2020 13:38 ADDENDUM: After further review and discussion with Dr. Iver NestleBhagat, there is a small 3 mm hyperintensity in the right posterior pons axial diffusion image 63. Possible ADC correlate. This could be an area of recent infarct however could also be chronic. Multiple additional areas of mildly increased signal on ADC in the pons bilaterally. Many of these are hyperintense on FLAIR and may be due to chronic ischemia. Electronically Signed   By: Marlan Palauharles   Clark M.D.   On: 04/18/2020 13:38   Result Date: 04/18/2020 CLINICAL DATA:  Acute neuro deficit.  Left facial droop. EXAM: MRI HEAD WITHOUT CONTRAST TECHNIQUE: Multiplanar, multiecho pulse sequences of the brain and surrounding structures were obtained without intravenous contrast. COMPARISON:  CT head 04/18/2020 FINDINGS: Brain: Extensive atrophy and ventricular enlargement. Right ventricle larger than the left likely due to chronic volume loss which is greater on the right. Extensive hippocampal atrophy bilaterally. Negative for acute infarct. Diffuse white matter hyperintensity bilaterally right greater than left due to chronic ischemia. Negative for hemorrhage or mass. Vascular: Hyperintensity of the petrous segment of the right internal carotid artery. This vessel is patent on CTA from today. This could be due to slow flow from proximal right internal carotid artery stenosis as well as poor outflow with severe stenosis right MCA. Skull and upper cervical spine: No focal skeletal abnormality. Sinuses/Orbits: Paranasal sinuses clear. Bilateral cataract extraction Other: None IMPRESSION: Negative for acute infarct Extensive atrophy with ventricular enlargement. Extensive chronic microvascular ischemic  change throughout the white matter, right greater than left. Electronically Signed: By: Franchot Gallo M.D. On: 04/18/2020 13:19   CT HEAD CODE STROKE WO CONTRAST  Result Date: 04/18/2020 CLINICAL DATA:  Code stroke. Acute neuro deficit. Left facial droop EXAM: CT HEAD WITHOUT CONTRAST TECHNIQUE: Contiguous axial images were obtained from the base of the skull through the vertex without intravenous contrast. COMPARISON:  CT head 03/30/2016 FINDINGS: Brain: Moderate atrophy with ventricular enlargement. Progressive atrophy since 2018. Patchy white matter hypodensity bilaterally right greater than left compatible with chronic ischemia. Negative for acute infarct, hemorrhage, mass Vascular: Question hyperdense  right MCA. This is only seen on 1 sagittal slice, image number 18, and not seen on axial images. This is questionably seen on coronal images. This could be atherosclerotic plaque versus a small segment of thrombus. This is questionably seen in 2018. Correlate with CTA results. Skull: Negative calvarium. Sinuses/Orbits: Paranasal sinuses clear. Bilateral cataract extraction. Other: None ASPECTS ([place] Stroke Program Early CT Score) - Ganglionic level infarction (caudate, lentiform nuclei, internal capsule, insula, M1-M3 cortex): 7 - Supraganglionic infarction (M4-M6 cortex): 3 Total score (0-10 with 10 being normal): 10 IMPRESSION: 1. No acute abnormality 2. ASPECTS is 10 3. Extensive atrophy with chronic microvascular ischemic change in the white matter 4. Question hyperdense right MCA versus atherosclerotic plaque. Correlate with upcoming CTA. Electronically Signed   By: Franchot Gallo M.D.   On: 04/18/2020 12:11    Procedures .Critical Care Performed by: Hayden Rasmussen, MD Authorized by: Hayden Rasmussen, MD   Critical care provider statement:    Critical care time (minutes):  45   Critical care time was exclusive of:  Separately billable procedures and treating other patients   Critical care was necessary to treat or prevent imminent or life-threatening deterioration of the following conditions:  CNS failure or compromise   Critical care was time spent personally by me on the following activities:  Discussions with consultants, evaluation of patient's response to treatment, examination of patient, ordering and performing treatments and interventions, ordering and review of laboratory studies, ordering and review of radiographic studies, pulse oximetry, re-evaluation of patient's condition, obtaining history from patient or surrogate, review of old charts and development of treatment plan with patient or surrogate     Medications Ordered in ED Medications  sodium chloride flush (NS) 0.9 %  injection 3 mL (3 mLs Intravenous Not Given 04/18/20 1237)  aspirin tablet 325 mg (has no administration in time range)    Followed by  aspirin chewable tablet 81 mg (has no administration in time range)  clopidogrel (PLAVIX) tablet 300 mg (has no administration in time range)    Followed by  clopidogrel (PLAVIX) tablet 75 mg (has no administration in time range)  atorvastatin (LIPITOR) tablet 40 mg (has no administration in time range)  iohexol (OMNIPAQUE) 350 MG/ML injection 60 mL (60 mLs Intravenous Contrast Given 04/18/20 1212)    ED Course  I have reviewed the triage vital signs and the nursing notes.  Pertinent labs & imaging results that were available during my care of the patient were reviewed by me and considered in my medical decision making (see chart for details).  Clinical Course as of 04/18/20 1736  Sat Apr 18, 2020  1346 Discussed with Dr. Curly Shores neurology.  She said she does think there is probably a small pontine infarct.  She has had a long discussion with family and her gauging whether they want her in the hospital versus returning to the facility. [  MB]    Clinical Course User Index [MB] Hayden Rasmussen, MD   MDM Rules/Calculators/A&P                         This patient complains of unresponsive episode left facial droop; this involves an extensive number of treatment Options and is a complaint that carries with it a high risk of complications and Morbidity. The differential includes stroke, bleed, seizure, dissection, metabolic derangement  I ordered, reviewed and interpreted labs, which included CBC with normal white count normal hemoglobin, chemistries normal other than mildly elevated glucose, normal coags I ordered medication oral aspirin I ordered imaging studies which included CT head, CT angio head and neck, MRI and I independently    visualized and interpreted imaging which showed probable acute pontine infarct Additional history obtained from  EMS Previous records obtained and reviewed prior records in epic including last admission about a year ago I consulted neuro hospitalist Dr. Curly Shores and discussed lab and imaging findings  Critical Interventions: Work-up and management of patient's acute neurologic findings within TPA window.  Patient determined not to be a TPA candidate secondary to prior poor functional capacity  After the interventions stated above, I reevaluated the patient and found patient to be hemodynamically stable.  Neurology had prolonged discussions with family and felt that the patient would be better served by returning to her facility and having an outpatient work-up.  Return instructions discussed with family.   Final Clinical Impression(s) / ED Diagnoses Final diagnoses:  Acute CVA (cerebrovascular accident) [facility])    Rx / [place] Orders ED Discharge Orders    None       Hayden Rasmussen, MD 04/18/20 1742

## 2021-06-24 ENCOUNTER — Emergency Department (HOSPITAL_COMMUNITY)
Admission: EM | Admit: 2021-06-24 | Discharge: 2021-06-24 | Disposition: A | Discharge status: Home / Self Care | Payer: Medicare Other | Attending: Emergency Medicine | Admitting: Emergency Medicine

## 2021-06-24 ENCOUNTER — Emergency Department (HOSPITAL_COMMUNITY): Payer: Medicare Other

## 2021-06-24 ENCOUNTER — Encounter (HOSPITAL_COMMUNITY): Payer: Self-pay

## 2021-06-24 DIAGNOSIS — F0390 Unspecified dementia without behavioral disturbance: Secondary | ICD-10-CM | POA: Insufficient documentation

## 2021-06-24 DIAGNOSIS — Z79899 Other long term (current) drug therapy: Secondary | ICD-10-CM | POA: Diagnosis not present

## 2021-06-24 DIAGNOSIS — S20211A Contusion of right front wall of thorax, initial encounter: Secondary | ICD-10-CM

## 2021-06-24 DIAGNOSIS — X58XXXA Exposure to other specified factors, initial encounter: Secondary | ICD-10-CM | POA: Insufficient documentation

## 2021-06-24 DIAGNOSIS — S20221A Contusion of right back wall of thorax, initial encounter: Secondary | ICD-10-CM | POA: Insufficient documentation

## 2021-06-24 DIAGNOSIS — Z7982 Long term (current) use of aspirin: Secondary | ICD-10-CM | POA: Diagnosis not present

## 2021-06-24 DIAGNOSIS — D649 Anemia, unspecified: Secondary | ICD-10-CM | POA: Insufficient documentation

## 2021-06-24 DIAGNOSIS — S299XXA Unspecified injury of thorax, initial encounter: Secondary | ICD-10-CM | POA: Diagnosis present

## 2021-06-24 LAB — CBC WITH DIFFERENTIAL/PLATELET
Abs Immature Granulocytes: 0.04 10*3/uL (ref 0.00–0.07)
Basophils Absolute: 0.1 10*3/uL (ref 0.0–0.1)
Basophils Relative: 1 %
Eosinophils Absolute: 0.1 10*3/uL (ref 0.0–0.5)
Eosinophils Relative: 1 %
HCT: 32.1 % — ABNORMAL LOW (ref 36.0–46.0)
Hemoglobin: 10 g/dL — ABNORMAL LOW (ref 12.0–15.0)
Immature Granulocytes: 0 %
Lymphocytes Relative: 18 %
Lymphs Abs: 1.7 10*3/uL (ref 0.7–4.0)
MCH: 30.2 pg (ref 26.0–34.0)
MCHC: 31.2 g/dL (ref 30.0–36.0)
MCV: 97 fL (ref 80.0–100.0)
Monocytes Absolute: 0.8 10*3/uL (ref 0.1–1.0)
Monocytes Relative: 9 %
Neutro Abs: 6.6 10*3/uL (ref 1.7–7.7)
Neutrophils Relative %: 71 %
Platelets: 219 10*3/uL (ref 150–400)
RBC: 3.31 MIL/uL — ABNORMAL LOW (ref 3.87–5.11)
RDW: 14.5 % (ref 11.5–15.5)
WBC: 9.4 10*3/uL (ref 4.0–10.5)
nRBC: 0 % (ref 0.0–0.2)

## 2021-06-24 LAB — COMPREHENSIVE METABOLIC PANEL
ALT: 30 U/L (ref 0–44)
AST: 33 U/L (ref 15–41)
Albumin: 3.2 g/dL — ABNORMAL LOW (ref 3.5–5.0)
Alkaline Phosphatase: 79 U/L (ref 38–126)
Anion gap: 6 (ref 5–15)
BUN: 12 mg/dL (ref 8–23)
CO2: 23 mmol/L (ref 22–32)
Calcium: 8.8 mg/dL — ABNORMAL LOW (ref 8.9–10.3)
Chloride: 109 mmol/L (ref 98–111)
Creatinine, Ser: 0.48 mg/dL (ref 0.44–1.00)
GFR, Estimated: >60 mL/min (ref >60)
Glucose, Bld: 110 mg/dL — ABNORMAL HIGH (ref 70–99)
Potassium: 3.8 mmol/L (ref 3.5–5.1)
Sodium: 138 mmol/L (ref 135–145)
Total Bilirubin: 0.8 mg/dL (ref 0.3–1.2)
Total Protein: 6.1 g/dL — ABNORMAL LOW (ref 6.5–8.1)

## 2021-06-24 LAB — LACTIC ACID, PLASMA
Lactic Acid, Venous: 1.6 mmol/L (ref 0.5–1.9)
Lactic Acid, Venous: 1.2 mmol/L (ref 0.5–1.9)

## 2021-06-24 LAB — PROTIME-INR
INR: 1.1 (ref 0.8–1.2)
Prothrombin Time: 13.7 seconds (ref 11.4–15.2)

## 2021-06-24 MED ORDER — IOHEXOL 300 MG/ML  SOLN
75.0000 mL | Freq: Once | INTRAMUSCULAR | Status: AC | PRN
  Administered 2021-06-24: 75 mL via INTRAVENOUS

## 2021-06-24 NOTE — ED Triage Notes (Signed)
Pt arrives from [facility] for c/o abscess in R axilla region. Pt recently given 50 mg doxycycline (currently on day 6 of tx). Pt with bruising noted across chest. Pt non-verbal at baseline.  ?

## 2021-06-24 NOTE — Discharge Instructions (Signed)
Follow-up with your primary care doctor.  Come back to ER if she develops redness, increased swelling, fever or other new concerning symptom. ?

## 2021-06-24 NOTE — ED Provider Triage Note (Signed)
Emergency Medicine Provider Triage Evaluation Note ? ?Wendy Hammond , a 88 y.o. female  was evaluated in triage.  Pt complains of right axilla abscess onset last week. Daughter notes bruising started last week. Denies fall, trauma, injury. Patient daughter notes that she was on doxycycline for an abscess and completed her prescription. Daughter notes that the abscess changed in size and is now hard and warm. Daughter denies the patient on blood thinners.  ? ?Review of Systems  ?Positive: As per HPI above ?Negative:  ? ?Physical Exam  ?BP (!) 188/73 (BP Location: Left Arm)   Pulse 72   Temp 98.6 ?F (37 ?C) (Oral)   Resp 16   SpO2 98%  ?Gen:   Awake, no distress  ?Resp:  Normal effort ?MSK:   Moves extremities without difficulty ?Other:  Ecchymosis noted to right axilla and extending into right chest wall with swelling noted to the area.  Mild induration and fluctuance noted.  Mild erythema noted to the area.  ? ?Medical Decision Making  ?Medically screening exam initiated at 2:47 PM.  Appropriate orders placed.  Lyndsey Demos was informed that the remainder of the evaluation will be completed by another provider, this initial triage assessment does not replace that evaluation, and the importance of remaining in the ED until their evaluation is complete. ?  ?Shay Jhaveri A, PA-C ?06/24/21 1523 ? ?

## 2021-06-24 NOTE — ED Provider Notes (Signed)
?[facility] ?Provider Note ? ? ?CSN: [id number] ?Arrival date & time: 06/24/21  1431 ? ?  ? ?History ? ?Chief Complaint  ?Patient presents with  ? Abscess  ? ? ?Jimia Gentles is a 88 y.o. female.  Presenting to the emergency room with concern for hematoma.  Family at bedside primarily provide history.  They state that the nursing facility noticed a few days or a week ago that she was having a bruise on her right chest.  Bruising seem to be getting worse and had increasing swelling today and so she was brought to the ER for evaluation.  Patient is baseline nonverbal, dementia, lives at memory care center at nursing facility.  She is not on a blood thinner. ? ?Most recent ER visit for stroke. ?HPI ? ?  ? ?Home Medications ?Prior to Admission medications   ?Medication Sig Start Date End Date Taking? Authorizing Provider  ?aspirin 81 MG chewable tablet Chew 1 tablet (81 mg total) by mouth daily. 04/18/20   Hayden Rasmussen, MD  ?atorvastatin (LIPITOR) 40 MG tablet Take 1 tablet (40 mg total) by mouth daily. 04/18/20   Hayden Rasmussen, MD  ?Calcium Carbonate-Vitamin D3 (CALCIUM 600/VITAMIN D) 600-400 MG-UNIT TABS Take 1 tablet by mouth 2 (two) times daily. 09/12/11   [provider]  ?clopidogrel (PLAVIX) 75 MG tablet Take 1 tablet (75 mg total) by mouth daily. 04/18/20   Hayden Rasmussen, MD  ?miconazole (BAZA ANTIFUNGAL) 2 % cream Apply 1 application topically 2 (two) times daily as needed (after diaper change).    [provider]  ?mirtazapine (REMERON) 15 MG tablet Take 1 tablet (15 mg total) by mouth at bedtime. 04/24/18   Nita Sells, MD  ?sertraline (ZOLOFT) 50 MG tablet Take 0.5 tablets (25 mg total) by mouth at bedtime. 04/24/18   Nita Sells, MD  ?   ? ?Allergies    ?Sulfa antibiotics and Penicillins   ? ?Review of Systems   ?Review of Systems  ?Unable to perform ROS: Dementia  ? ?Physical Exam ?Updated Vital Signs ?BP 132/75   Pulse 83    Temp 98.6 ?F (37 ?C) (Oral)   Resp 17   SpO2 91%  ?Physical Exam ?Vitals and nursing note reviewed.  ?Constitutional:   ?   General: She is not in acute distress. ?   Appearance: She is well-developed.  ?HENT:  ?   Head: Normocephalic and atraumatic.  ?Eyes:  ?   Conjunctiva/sclera: Conjunctivae normal.  ?Cardiovascular:  ?   Rate and Rhythm: Normal rate and regular rhythm.  ?   Heart sounds: No murmur heard. ?Pulmonary:  ?   Effort: Pulmonary effort is normal. No respiratory distress.  ?   Breath sounds: Normal breath sounds.  ?Chest:  ?   Comments: There is large ecchymosis across the entirety of her anterior right chest wall extending towards right axilla region; there is no overlying erythema or induration, no crepitus noted ?Abdominal:  ?   Palpations: Abdomen is soft.  ?   Tenderness: There is no abdominal tenderness.  ?Musculoskeletal:     ?   General: No swelling.  ?   Cervical back: Neck supple.  ?Skin: ?   General: Skin is warm and dry.  ?   Capillary Refill: Capillary refill takes less than 2 seconds.  ?Neurological:  ?   Mental Status: She is alert.  ?Psychiatric:     ?   Mood and Affect: Mood normal.  ? ? ?ED  Results / Procedures / Treatments   ?Labs ?(all labs ordered are listed, but only abnormal results are displayed) ?Labs Reviewed  ?COMPREHENSIVE METABOLIC PANEL - Abnormal; Notable for the following components:  ?    Result Value  ? Glucose, Bld 110 (*)   ? Calcium 8.8 (*)   ? Total Protein 6.1 (*)   ? Albumin 3.2 (*)   ? All other components within normal limits  ?CBC WITH DIFFERENTIAL/PLATELET - Abnormal; Notable for the following components:  ? RBC 3.31 (*)   ? Hemoglobin 10.0 (*)   ? HCT 32.1 (*)   ? All other components within normal limits  ?CULTURE, BLOOD (ROUTINE X 2)  ?CULTURE, BLOOD (ROUTINE X 2)  ?LACTIC ACID, PLASMA  ?LACTIC ACID, PLASMA  ?PROTIME-INR  ?CBC WITH DIFFERENTIAL/PLATELET  ? ? ?EKG ?None ? ?Radiology ?No results found. ? ?Procedures ?Procedures  ? ? ?Medications Ordered in  ED ?Medications  ?iohexol (OMNIPAQUE) 300 MG/ML solution 75 mL (75 mLs Intravenous Contrast Given 06/24/21 1933)  ? ? ?ED Course/ Medical Decision Making/ A&P ?  ?                        ?Medical Decision Making ? ?88 year old lady presenting to ER from nursing facility, memory care unit with concern for bruise to right chest wall.  Has been present for at least a few days to 1 week, potentially longer per family.  CT scan concerning for a very large right pectoralis major muscle hematoma.  There is no evidence for active extravasation or vascular injury.  Lab work was reviewed, mild anemia, hemoglobin 10, 1 year ago hemoglobin was 13 but 3 years ago hemoglobin was 10 at the time.  No more recent for comparison.  Due to the size of the hematoma, I discussed the case with trauma surgery, Dr. Cherlyn Roberts.  He advises supportive care, patient does not need any intervention or admission at this time.  Should resolve on its own.  I discussed these recommendations with patient's family at bedside.  Her vitals are stable and she remains well-appearing in no distress.  Will discharge back to her facility. ? ? ? ? ? ? ?Final Clinical Impression(s) / ED Diagnoses ?Final diagnoses:  ?Hematoma of right chest wall, initial encounter  ?Anemia, unspecified type  ?Dementia, unspecified dementia severity, unspecified dementia type, unspecified whether behavioral, psychotic, or mood disturbance or anxiety ([place])  ? ? ?Rx / DC Orders ?ED Discharge Orders   ? ? None  ? ?  ? ? ?  ?Lucrezia Starch, MD ?06/24/21 2201 ? ?

## 2021-06-24 NOTE — ED Notes (Signed)
Patient transported to CT 

## 2021-06-29 LAB — CULTURE, BLOOD (ROUTINE X 2)
Culture: NO GROWTH
Culture: NO GROWTH

## 2022-02-18 DEATH — deceased
